# Patient Record
Sex: Female | Born: 1970 | Race: White | Hispanic: No | Marital: Married | Smoking: Current every day smoker
Health system: Southern US, Community
[De-identification: ages and names within clinical notes are randomized; demographics above are authoritative.]

## PROBLEM LIST (undated history)

## (undated) DIAGNOSIS — E079 Disorder of thyroid, unspecified: Secondary | ICD-10-CM

## (undated) DIAGNOSIS — M549 Dorsalgia, unspecified: Secondary | ICD-10-CM

## (undated) DIAGNOSIS — I10 Essential (primary) hypertension: Secondary | ICD-10-CM

## (undated) DIAGNOSIS — D649 Anemia, unspecified: Secondary | ICD-10-CM

## (undated) DIAGNOSIS — F329 Major depressive disorder, single episode, unspecified: Secondary | ICD-10-CM

## (undated) DIAGNOSIS — G8929 Other chronic pain: Secondary | ICD-10-CM

## (undated) DIAGNOSIS — F419 Anxiety disorder, unspecified: Secondary | ICD-10-CM

## (undated) DIAGNOSIS — K219 Gastro-esophageal reflux disease without esophagitis: Secondary | ICD-10-CM

## (undated) DIAGNOSIS — F32A Depression, unspecified: Secondary | ICD-10-CM

## (undated) HISTORY — DX: Gastro-esophageal reflux disease without esophagitis: K21.9

## (undated) HISTORY — DX: Disorder of thyroid, unspecified: E07.9

## (undated) HISTORY — PX: APPENDECTOMY: SHX54

## (undated) HISTORY — DX: Anemia, unspecified: D64.9

## (undated) HISTORY — PX: TUBAL LIGATION: SHX77

---

## 2001-03-20 ENCOUNTER — Ambulatory Visit (HOSPITAL_COMMUNITY): Admission: RE | Admit: 2001-03-20 | Discharge: 2001-03-20 | Payer: Self-pay | Admitting: Family Medicine

## 2001-03-20 ENCOUNTER — Encounter: Payer: Self-pay | Admitting: Family Medicine

## 2001-10-25 ENCOUNTER — Other Ambulatory Visit: Admission: RE | Admit: 2001-10-25 | Discharge: 2001-10-25 | Payer: Self-pay | Admitting: Unknown Physician Specialty

## 2001-11-01 ENCOUNTER — Encounter: Payer: Self-pay | Admitting: Unknown Physician Specialty

## 2001-11-01 ENCOUNTER — Ambulatory Visit (HOSPITAL_COMMUNITY): Admission: RE | Admit: 2001-11-01 | Discharge: 2001-11-01 | Payer: Self-pay | Admitting: Unknown Physician Specialty

## 2003-06-20 ENCOUNTER — Inpatient Hospital Stay (HOSPITAL_COMMUNITY): Admission: EM | Admit: 2003-06-20 | Discharge: 2003-06-21 | Payer: Self-pay | Admitting: Surgery

## 2003-06-20 ENCOUNTER — Encounter (INDEPENDENT_AMBULATORY_CARE_PROVIDER_SITE_OTHER): Payer: Self-pay | Admitting: Specialist

## 2004-03-10 ENCOUNTER — Observation Stay (HOSPITAL_COMMUNITY): Admission: AD | Admit: 2004-03-10 | Discharge: 2004-03-11 | Payer: Self-pay | Admitting: Internal Medicine

## 2004-04-13 ENCOUNTER — Ambulatory Visit: Payer: Self-pay | Admitting: Internal Medicine

## 2007-01-18 ENCOUNTER — Emergency Department (HOSPITAL_COMMUNITY): Admission: EM | Admit: 2007-01-18 | Discharge: 2007-01-18 | Payer: Self-pay | Admitting: Emergency Medicine

## 2007-02-10 ENCOUNTER — Ambulatory Visit (HOSPITAL_COMMUNITY): Admission: RE | Admit: 2007-02-10 | Discharge: 2007-02-10 | Payer: Self-pay | Admitting: Internal Medicine

## 2007-03-24 ENCOUNTER — Ambulatory Visit (HOSPITAL_COMMUNITY): Admission: RE | Admit: 2007-03-24 | Discharge: 2007-03-24 | Payer: Self-pay | Admitting: Family Medicine

## 2007-06-22 ENCOUNTER — Ambulatory Visit (HOSPITAL_COMMUNITY): Admission: RE | Admit: 2007-06-22 | Discharge: 2007-06-22 | Payer: Self-pay | Admitting: Internal Medicine

## 2007-08-22 ENCOUNTER — Encounter
Admission: RE | Admit: 2007-08-22 | Discharge: 2007-11-20 | Payer: Self-pay | Admitting: Physical Medicine & Rehabilitation

## 2007-08-22 ENCOUNTER — Ambulatory Visit: Payer: Self-pay | Admitting: Physical Medicine & Rehabilitation

## 2007-10-04 ENCOUNTER — Ambulatory Visit: Payer: Self-pay | Admitting: Physical Medicine & Rehabilitation

## 2008-01-03 ENCOUNTER — Encounter
Admission: RE | Admit: 2008-01-03 | Discharge: 2008-01-17 | Payer: Self-pay | Admitting: Physical Medicine & Rehabilitation

## 2008-01-17 ENCOUNTER — Ambulatory Visit: Payer: Self-pay | Admitting: Physical Medicine & Rehabilitation

## 2008-04-16 ENCOUNTER — Encounter
Admission: RE | Admit: 2008-04-16 | Discharge: 2008-04-17 | Payer: Self-pay | Admitting: Physical Medicine & Rehabilitation

## 2008-04-17 ENCOUNTER — Ambulatory Visit: Payer: Self-pay | Admitting: Physical Medicine & Rehabilitation

## 2008-09-03 ENCOUNTER — Other Ambulatory Visit: Admission: RE | Admit: 2008-09-03 | Discharge: 2008-09-03 | Payer: Self-pay | Admitting: Obstetrics and Gynecology

## 2008-09-05 ENCOUNTER — Encounter
Admission: RE | Admit: 2008-09-05 | Discharge: 2008-09-09 | Payer: Self-pay | Admitting: Physical Medicine & Rehabilitation

## 2008-09-05 ENCOUNTER — Ambulatory Visit (HOSPITAL_COMMUNITY): Admission: RE | Admit: 2008-09-05 | Discharge: 2008-09-05 | Payer: Self-pay | Admitting: Obstetrics & Gynecology

## 2008-09-09 ENCOUNTER — Ambulatory Visit: Payer: Self-pay | Admitting: Physical Medicine & Rehabilitation

## 2009-11-06 ENCOUNTER — Ambulatory Visit (HOSPITAL_COMMUNITY): Admission: RE | Admit: 2009-11-06 | Discharge: 2009-11-06 | Payer: Self-pay | Admitting: Internal Medicine

## 2010-08-02 ENCOUNTER — Encounter: Payer: Self-pay | Admitting: Otolaryngology

## 2010-11-24 NOTE — Assessment & Plan Note (Signed)
A 40 year old female who has been followed in this clinic for  approximately 1 years' time.  She has a history of low back as well as  mid back pain.  She states that she has had pain for about 5 years.  She  has had a MRI in January 18, 2007 that demonstrated L4-5, L5-S1 mild facet  joint degenerative changes and a mild bulge at T11-12.  She has had no  traumatic event.  She remembers an incident where she had pain following  carrying around a 5 gallon bucket around the leg.  She has been treated  by primary care using Flexeril and Percocet and referred to this clinic.  She states she has had some physical therapy in the past.  She continues  to what sounds like flexion exercises.  She has had a thoracic spine MRI  demonstrated an T11-12 disk right paracentral nerve root encroachment.  She has had no spine interventional procedures.  She has used a TENS  unit at night.  She uses Soma in addition to her Percocet and has used  Skelaxin before, but this was too expensive with co-pays.  Other  medications include Celexa and Xanax prescribed by primary care.  She  also later has a history of being on Tussionex before urine drug screen  was performed.   PHYSICAL EXAMINATION:  GENERAL:  No acute distress.  She states she ran  out of her medicine on Sunday morning about 2:00 a.m. was the last dose.  The pain does improve with medication TENS, increases with walking,  bending, sitting, and standing.  She drives.  She climbs steps.  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  She has review  of systems positive for depression, anxiety, which improves with  medications as well as spasms in her back.  She states that she is being  evaluated for possible hysterectomy.   Her blood pressure is 146/78, pulse 91, respirations 18, and O2 sat 99%  on room air.   Overweight female in no acute stress.  Orientation x3.  Affect is alert.  Gait is normal.  She has pain more with toe walk and  heel walk, but has  no weakness in her lower extremities as result of this.  She has some  tenderness to palpation around the thoracic paraspinals, T6-7 and in the  lumbar paraspinals L4-5 and S1 areas.  Upper and lower extremity  sensation is normal.  Extremity and lower extremity deep tendon reflexes  are normal.   Upper and lower extremity range of motion and joint stability is normal.  Neck, range of motion is full but has pain with extension that radiates  down towards the upper thoracic area.   In her lumbar spine, she has 50% forward flexion and extension.   Motor strength is full in upper and lower extremities.   IMPRESSION:  1. Lumbosacral spondylosis.  I believe this is likely a pain      generator, but we need to further confirm using fluoroscopically-      guided injections.  2. She states that she may be undergoing hysterectomy soon so      certainly, we would defer until after that has done.  3. Thoracic pain.  I do not think that T11-12 disk is symptomatic.      This may actually be lower cervical spondylosis causing some facet      syndrome and radiating pain into the parascapular area.  This  could      also be further evaluated with cervical medial branch blocks, but      once again we will defer until after surgery.  In terms of her      narcotic analgesic in the postoperative pain management, we will      let OB/GYN manages for the first month postop and then we will      resume.  In preparation for improved postsurgical pain management,      we will drop her dose to 5 times per day and a Percocet 4 times a      day for a week and thereafter in the preoperative time.  4. We will switch her from Soma to Robaxin, given the metabolite of      meprobamate which may further interact given that she is already on      benzodiazepines.   I discussed with the patient.  She is in agreement with the above plan.  We will check the urine drug screen today.  We will need to  do [facility] check to see who is prescribing Tussionex and whether  any other prescribers are evident.      Erick Colace, M.D.  Electronically Signed     AEK/MedQ  D:  09/09/2008 13:59:21  T:  09/10/2008 03:49:44  Job #:  [id number]   cc:   Catalina Pizza, M.D.  Fax: [phone]   Cyril Mourning, OB/GYN, [place]

## 2010-11-24 NOTE — Assessment & Plan Note (Signed)
[name] returns to the clinic today for followup evaluation.  She  reports that she is getting a good bit of relief from the Percocet and  Soma medication.  She generally takes approximately 6 Percocet per day  and generally 2-3 Soma per day.  She plans to start a diet, but still is  smoking 1 pack of cigarettes per day.  She reports that she would like  to lose weight and come off tobacco if at all possible.  Her medications  otherwise have been unchanged.   MEDICATIONS:  1. Celexa 20 mg daily.  2. Xanax 0.5 mg q.i.d. p.r.n.  3. Pro-Air p.r.n.  4. Percocet 10/325 one tablet 6 times per day p.r.n.  5. Soma 350 mg t.i.d. p.r.n.   REVIEW OF SYSTEMS:  Noncontributory.   PHYSICAL EXAMINATION:  GENERAL:  Well-appearing, healthy adult female in  mild acute discomfort.  VITAL SIGNS:  Blood pressure 136/88 with a pulse of 94, respiratory rate  18, and O2 saturation 98% on room air.  EXTREMITIES:  She has 5-/5 strength throughout the bilateral upper and  lower extremities.  She ambulates without any assistive device.   IMPRESSION:  Chronic mid back pain/low back pain with evidence of minor  disk bulge at T11-T12.   In the office today, no refill on medication is necessary as those were  recently refilled a few days ago.  We will plan on seeing the patient in  followup in approximately 3 months' time with refills prior to that  appointment if necessary.           ______________________________  Ellwood Dense, M.D.     DC/MedQ  D:  04/17/2008 09:46:53  T:  04/18/2008 00:05:37  Job #:  [id number]

## 2010-11-24 NOTE — Assessment & Plan Note (Signed)
HISTORY:  [name] returns to clinic today for followup evaluation.  She reports that overall she is doing well.  She does take a Percocet,  generally 5 tablets per day, but does use an occasional extra tablet  through the night.  She wonders about possibly having an adjustment in  the medication.  She also takes her Soma, generally 2 to 3 times per  day, but does not need a refill on the Wall.  She does need a refill on  the Percocet.  Her other medicines have stayed unchanged including the  Celexa and Xanax.   MEDICATIONS:  1. Celexa 20 mg daily.  2. Percocet 10/325 one tablet 5-6 times per day p.r.n.  3. Xanax 0.5 mg q.i.d. p.r.n.  4. ProAir p.r.n.   REVIEW OF SYSTEMS:  Noncontributory.   PHYSICAL EXAMINATION:  GENERAL:  Well-appearing, healthy adult female in  mild-to-no acute discomfort.  VITAL SIGNS:  Blood pressure is 139/85 with a pulse of 86, respiratory  rate 18, and O2 saturation 98% on room air.  She has 5-/5 strength  throughout the bilateral upper and lower extremities.  She ambulates  without any assisted device.   IMPRESSION:  Chronic mid back pain/low back pain with evidence of minor  disk bulge at T11-T12.   In the office today, we did adjust the patient's Percocet up to a  maximum of 6 tablets per day, a total of 180 were prescribed.  Prescription was written today, January 17, 2008.  No refill on the Tresa Garter is  necessary at this time.  We plan on seeing her in followup in  approximately 3 months' time.           ______________________________  Ellwood Dense, M.D.     DC/MedQ  D:  01/17/2008 09:51:59  T:  01/18/2008 02:59:40  Job #:  [id number]

## 2010-11-24 NOTE — Assessment & Plan Note (Signed)
[name] returns to the clinic today for follow-up evaluation.  I  first and last saw her in this office on August 23, 2007 for  evaluation and treatment of chronic low back pain.  At that time, we had  decided to continue her on the Percocet 10/325 1 tablet q.i.d. p.r.n.  along with the Skelaxin 800 mg t.i.d. and the Ultram 50 mg q.i.d. p.r.n.  She reports that she has really noticed no improvement with the Ultram  and would like to stop that medication.  The Skelaxin is not available  in a generic form, and it is too expensive for her with her insurance.  She would like to try a different muscle relaxer.  I have discussed with  her Robaxin and Soma as alternatives, and she will check with her  pharmacist to see how expensive those might be for her.   In terms of the Percocet, she has been using 4-5 tablets per day.  She  would like to have an adjustment up to 5 tablets per day, as that does  give her good benefit overall.  She continues to get Xanax and Celexa  through her primary care physician.   MEDICATIONS:  1. Celexa 20 mg daily.  2. Percocet 10/325 1 tablet 4-5 times per day p.r.n.  3. Xanax 0.5 mg q.i.d. p.r.n.  4. Skelaxin 800 mg t.i.d. p.r.n. (too expensive).  5. Ultram 50 mg q.i.d. p.r.n. (not helpful).  6. ProAire p.r.n.   REVIEW OF SYSTEMS:  Noncontributory.   PHYSICAL EXAMINATION:  A well-appearing, healthy adult female in mild  acute discomfort.  Blood pressure 147/88 with a pulse of 91, respiratory rate 18, O2  saturation 99% on room air.  Patient ambulates without any assistive device.  She is -5/5 strength  throughout the bilateral upper and lower extremities.   IMPRESSION:  Chronic mid back pain/low back pain with evidence of minor  disk bulge at T11-12.   In the office today, we did have the patient stop the Ultram completely.  We gave her samples of Celexa and asked her to ask her pharmacist about  the cost of Soma versus Robaxin in place of the  Skelaxin.  We allowed  her to go up to 5 tablets per day on her present prescription with the  Percocet and then refilled that at five times per day as of October 25, 2007.  Will plan on seeing the patient in followup in approximately  three months time with refills prior to that appointment as necessary.  She will be calling back to let us know which one of the muscle  relaxers, either the Robaxin or Soma, she would like to use in place of  the Skelaxin.           ______________________________  Ellwood Dense, M.D.     DC/MedQ  D:  10/06/2007 15:49:08  T:  10/06/2007 18:25:04  Job #:  [id number]

## 2010-11-24 NOTE — Group Therapy Note (Signed)
PURPOSE OF EVALUATION:  Evaluate and treat chronic back pain.   HISTORY OF PRESENT ILLNESS:  [name] is a 40 year old adult female  referred to this office by Dr. Catalina Pizza, her present primary care  physician, and Dr. Sherwood Gambler, her prior primary care physician.   The patient has medical records that were sent prior to the office visit  today, and then those were reviewed with her in the office today.   The patient reports off and on back pain for the past 12 years since her  last children were born, including twins.  She reports that the pain had  been off and on, but then started becoming more consistent and flaring  mid summer 2008.  She reports that she specifically remembers a time  when she was carrying a 5-pound bucket around [place], [place] with her  children, when she had the acute pain.   The patient reports that Dr. Sherwood Gambler, her primary care physician,  initially treated her with anti-inflammatory medications without  substantial improvement.   January 18, 2007 the patient underwent an MRI scan of her lumbar spine which  showed T11-T12 mild bulge, along with mild facet joint degenerative  changes at L4-L5 and L5-S1.   January 20, 2007 the patient saw Dr. Sherwood Gambler in followup.  At that time she  was prescribed Flexeril along with Percocet and voltaren for low back  pain with radiculopathy.   February 10, 2007 the patient had a refill on her Percocet 10/325 one table  q.6 h., and was referred for pain management.   February 10, 2007 the patient underwent an MRI scan of her thoracic spine  which showed shallow, right paracentral protrusion at T11-T12 without  apparent neural encroachment.  Otherwise, MRI scan of her thoracic spine  was negative.   March 07, 2007 the patient underwent a Depo-Medrol injection of 80 mg  into her left hip.   The patient had her Percocet refilled as of August 2008.  She had a fall  subsequent to that, and x-rays of her coccyx and sacrum were negative  for a fracture.  She started using a TENS unit approximately September  2008, and continues to use that on a daily basis.  She had her Percocet  refilled as of October and November 2008 though Dr. Sherwood Gambler.   June 21, 2007 the patient underwent blood testing, including  rheumatoid arthritis, uric acid, ANA titer, lime disease titer.  All of  these were within normal limits.   December 2008, the patient underwent an MRI scan of her left shoulder  after she had problems elevating her left shoulder.  That was negative  for occult osseous injury with no evidence of rotator cuff injury and no  labral tear.  There was no osseous stress changes to suggest  impingement.   Presently, the patient reports that she has pain located in her area  between her shoulder blades posteriorly.  She complains of some lower  back pain, but most of the pain is located between the shoulder blades  just at the bra line.  She reports that she gets a fair amount of relief  from the Percocet, Ultram, and Skelaxin as noted below.  She uses an  average of approximately 4 per day of the Percocet and that seems to  give her a good bit of relief.  She denies any constipation or  grogginess related to the medicines, although she does report she gets  only slightly sleepy with the Skelaxin.  She does use her TENS unit on a  daily basis along with her home exercise program.  She denies any bowel  or bladder incontinence.   PAST MEDICAL HISTORY:  1. History of sinusitis/pharyngitis/bronchitis.  2. Depression.  3. Prior appendectomy.  4. Prior tubal ligation.   ALLERGIES:  No known drug allergies.   FAMILY HISTORY:  Her mother is deceased from a stroke and her father is  alive and well.   SOCIAL HISTORY:  The patient is single and has 4 children; 2 age 81, 1  age 59, and 1 age 2.  She reports rare alcohol usage and smokes  approximately 1 pack per day of cigarettes.  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   MEDICATIONS:  1. Celexa 20 mg daily.  2. Percocet 10/325 one table q.i.d. p.r.n.  3. Xanax 0.5 mg q.i.d. p.r.n.  4. Skelaxin 800 mg t.i.d. p.r.n.  5. Ultram 50 mg q.i.d. p.r.n.  6. ProAir p.r.n.   REVIEW OF SYSTEMS:  Positive for weight gain along with respiratory  infections and coughing.   PHYSICAL EXAMINATION:  A well-appearing, healthy, adult female.  Height 5 feet 1 inch, weight 200 pounds, blood pressure 147/83 with a  pulse of 80 and O2 saturation 99% on room air.  The patient ambulates without any assistive device.  She is casually  dressed in her uniform from the dermatology office.  Upper extremity range of motion was full and pain free, but the patient  does report having taking Percocet prior to the office visit today.  She  reports that most of the upper extremity range of motion exercises would  have been painful for her if she had not taken the Percocet.  Lumbar  range of motion showed only a slight decrease in extension and lateral  bending with good flexion, although she, again, reported low back pain  with forward flexion.  Upper extremity exam showed normal bulk and tone  throughout.  Reflexes were 2+ and symmetrical and sensation was intact  to light touch throughout the bilateral upper extremities.  Strength was  5-/5 throughout the bilateral upper extremities.  Lower extremity exam showed normal bulk and tone throughout.  Strength  was 5-/5 in hip flexion, knee extension, and ankle dorsiflexion with  normal reflexes and normal sensation.  In the supine position, straight  leg raise was negative to 30 degrees, and range of motion was within  functional limits.   IMPRESSION:  Chronic mid back pain/low back pain with evidence of minor  disk bulge at T11-T12 on MRI scan.   Presently the patient's pain is mostly located in the thoracic region  between the shoulder blades.  Her only  abnormality on MRI scan was a  slight bulge at T11-T12.  It may be that most of her pain in the mid  thoracic region is secondary to muscular tightness.  I have asked her to  continue her TENS unit along with her home exercise program.  We will  take over prescribing her Skelaxin, Ultram and Percocet for her.  She  will continue to get her Celexa and Xanax from her primary care  physician at this point.   No refill on her Percocet, Skelaxin, or Ultram are necessary at this  point.  When she does need a refill, I have asked her to call in  approximately 5 days ahead of time so that we can get  that for her, and  she can have someone pick it up for her if she is not available.  Otherwise, we will plan on seeing the patient in followup in  approximately 2 months' time.  The patient does report relief that she  does not have to have injections.  She was worried about injections, and  apparently does not wish to have any, at least at the present time.   We will plan on seeing the patient in followup as noted above.           ______________________________  Ellwood Dense, M.D.     DC/MedQ  D:  08/23/2007 11:33:24  T:  08/24/2007 13:21:34  Job #:  [id number]   cc:   Catalina Pizza, M.D.  Fax: [phone]

## 2010-11-27 NOTE — Op Note (Signed)
NAME:  [name], [name] NO.:  [id number]   MEDICAL RECORD NO.:  [id number]                   PATIENT TYPE:  INP   LOCATION:  0001                                 FACILITY:  [facility]   PHYSICIAN:  Abigail Miyamoto, M.D.              DATE OF BIRTH:  [birth year]   DATE OF PROCEDURE:  06/20/2003  DATE OF DISCHARGE:                                 OPERATIVE REPORT   PREOPERATIVE DIAGNOSIS:  Acute appendicitis.   POSTOPERATIVE DIAGNOSIS:  Acute appendicitis.   PROCEDURE:  Laparoscopic appendectomy.   SURGEON:  Abigail Miyamoto, M.D.   ANESTHESIA:  General endotracheal anesthesia and 0.25% Marcaine.   ESTIMATED BLOOD LOSS:  Minimal.   INDICATIONS FOR PROCEDURE:  [name] is a 40 year old female who  presents to [facility]'s office after a CAT scan showed  possible retrocecal appendicitis.  The patient did have mild abdominal  tenderness but had a normal white blood count and she was sent to the [facility] by Dr. Darnell Level and underwent a second CAT scan but this  time with oral contrast which confirmed acute appendicitis.   FINDINGS:  The patient was indeed found to have acute appendicitis without  evidence of perforation or rupture.   DESCRIPTION OF PROCEDURE:  The patient was brought to the operating room,  identified as [name].  She was placed supine on the operating table  and general anesthesia was induced.  Her abdomen was then prepped and draped  in the usual sterile fashion.  Using a #15 blade, a small vertical incision  was made below the umbilicus. The incision was carried down through the  fascia to the _________with the scalpel.  A hemostat was used to pass  through the peritoneal cavity.  Next a #0 Vicryl pursestring suture was  placed around the fascial opening.  The Hasson port was placed through the  opening and insufflation of the abdomen was begun. A 5 mm port was then  placed in the patient's  epigastrium under direct vision.  The cecum was  identified and rotated medially. The appendix was found to be underneath the  cecum and it lifted up easily and was found to have acute appendicitis.  Next a 12 mm port was placed in the patient's midline at the pubis.  The  appendix was then elevated. The mesoappendix was dissected out and was taken  down with the harmonic scalpel.  This allowed visualization of the base of  the appendix at the cecum.  The appendix was then transected at the base  with an endoscopic GIA stapler.  Once the appendix was transected, the  appendiceal stump was examined and hemostasis felt to be achieved.  The  appendix was then placed in a sac and pulled out through the incision at the  umbilicus.  The #0 Vicryl at the umbilicus was tied in place closing the  fascial defect.  The  cecum and appendiceal stump were again examined and  hemostasis was felt to be achieved. All ports were then removed under direct  vision and the abdomen was deflated.  All incisions were then anesthetized with 0.25% Marcaine and closed with 4-0  Monocryl subcuticular sutures. Steri-Strips, gauze and tape were then  applied. All sponge, needle and instrument counts were correct at the end of  the procedure.  The patient was then extubated in the operating room and  taken in stable condition to the recovery room.                                               Abigail Miyamoto, M.D.    DB/MEDQ  D:  06/20/2003  T:  06/21/2003  Job:  [id number]

## 2010-11-27 NOTE — H&P (Signed)
[name], [name] NO.:  [id number]   MEDICAL RECORD NO.:  [id number]                   PATIENT TYPE:   LOCATION:                                       FACILITY:   PHYSICIAN:  Madelin Rear. Sherwood Gambler, M.D.             DATE OF BIRTH:  1970-10-14   DATE OF ADMISSION:  DATE OF DISCHARGE:                                HISTORY & PHYSICAL   CHIEF COMPLAINT:  Passing out.   HISTORY OF PRESENT ILLNESS:  The patient has noted progressively increasing  orthostatic dizziness and near syncope x2 days.  She was seen on March 06, 2004 with asthmatic bronchitis.  Nebulizer treatment and Levapak were  prescribed, taken and she had some improvement in her sinus drainage and  cough.  The cough is now nonproductive although still present.  She denies  any vertigo.   PAST MEDICAL HISTORY:  1.  Bronchitis.  2.  Bronchospasms.   SOCIAL HISTORY:  Positive smoker, negative alcohol or other drug use.   FAMILY HISTORY:  Noncontributory for this illness.   REVIEW OF SYSTEMS:  As under HPI, denies specifically any palpitations,  headache, stiff neck, photophobia, chest pain, hematemesis, hematochezia,  melena or heavy menstrual flow.  Review of systems otherwise negative.   PHYSICAL EXAMINATION:  GENERAL:  She is awake, alert and cooperative.  HEENT:  No JVD or adenopathy.  Neck was supple.  CHEST:  Scattered rhonchi.  CARDIAC:  Regular rate and rhythm without murmurs, rubs or gallops.  ABDOMEN:  Soft, nontender, no organomegaly or masses.  EXTREMITIES:  Without clubbing, cyanosis or edema.  NEUROLOGIC:  Normal.   ASSESSMENT IN THE OFFICE:  Orthostatic vital signs revealed 80-90 lying with  a rapid increase in her pulse to greater than 110 on standing.  She had no  symptoms of diaphoresis in the office.   IMPRESSION:  Orthostatic near syncope with hypotension and tachycardia.  The  patient appears to be volume depleted and not septic at present.  We will  admit to  the hospital for IV hydration, rule out hypovolemia, rule out  sepsis, rule out pulmonary embolus.  Endocrinopathies will also be sought  with rule out addisonian crisis, although doubtful based on history and  exam.    ___________________________________________                                         Madelin Rear. Sherwood Gambler, M.D.   LJF/MEDQ  D:  03/10/2004  T:  03/10/2004  Job:  [id number]

## 2010-11-27 NOTE — H&P (Signed)
NAME:  [name] NO.:  [id number]   MEDICAL RECORD NO.:  [id number]                   PATIENT TYPE:   LOCATION:                                       FACILITY:   PHYSICIAN:  Velora Heckler, M.D.                DATE OF BIRTH:  [birth year]   DATE OF ADMISSION:  06/20/2003  DATE OF DISCHARGE:                                HISTORY & PHYSICAL   REFERRING PHYSICIAN:  Sharlot Gowda, M.D.   PRIMARY CARE PHYSICIAN:  Patrica Duel, M.D.   CHIEF COMPLAINT:  Abdominal pain, rule out appendicitis.   HISTORY OF PRESENT ILLNESS:  The patient is a 40 year old white female who  presents with five-day history of abdominal discomfort, nausea, and low  grade fever.  The patient was seen and evaluated yesterday by Dr. Sharlot Gowda.  She was found to have a normal white count.  She underwent CT scan  of abdomen and pelvis at [facility] which showed findings  suspicious for retrocecal appendix with possible early appendicitis.  The  patient's abdominal discomfort is localized to the right lower quadrant.  She is anorectic.  She presents today at our office for assessment.   PAST MEDICAL HISTORY:  1. Status post D&C, 1993.  2. Status post tubal ligation, 1996.  3. Status post oral surgery, 1998.   MEDICATIONS:  1. Allegra D p.r.n.  2. Motrin p.r.n.   ALLERGIES:  None known.   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  She smokes a pack of cigarettes a day.  She does not drink  alcohol.  She is accompanied by her husband.  She has four children.   FAMILY HISTORY:  Notable for stroke, hypertension, COPD in the patient's  mother.   REVIEW OF SYSTEMS:  Fifteen system review documented in our medical record  notable for sinus problems, nausea, vomiting.  Pap smear up to date.   PHYSICAL EXAMINATION:  GENERAL:  A 40 year old ill-appearing white female in  mild to moderate discomfort.  VITAL SIGNS:   Temperature 99.4, pulse 82, blood pressure 123/83, weight 160  pounds.  HEENT:  Shows her to be normocephalic.  Sclerae are clear.  Conjunctivae are  clear.  Pupils are equal and reactive.  Mucous membranes are dry.  Dentition  is good.  Voice is normal.  NECK:  Supple without mass.  Thyroid is normal without nodularity.  There is  no lymphadenopathy.  LUNGS:  Clear to auscultation with few scattered rhonchi and expiratory  wheezes.  CARDIAC:  Shows regular rate and rhythm without murmur.  ABDOMEN:  Soft.  Bowel sounds are present.  There are striae on the  abdominal wall.  There are well-healed surgical wounds.  Palpation reveals  mild to moderate tenderness in the right lower quadrant.  There is mild  guarding.  There is no rebound.  There is no palpable mass.  EXTREMITIES:  Nontender without edema.  NEUROLOGIC:  The patient is alert and oriented without focal deficit.   LABORATORY DATA:  CT scan reviewed with Dr. Delphia Grates at [facility].   Laboratory studies from December 8 at [facility]  show a white count of 5.1, hemoglobin 14.1, hematocrit 40.8%, platelet count  253,000.  Chemistry profile is normal.  Liver function tests are normal.   IMPRESSION:  Abdominal pain, rule out retrocecal appendix with acute  appendicitis.   PLAN:  1. Admit patient to [facility].  2. CT scan of abdomen and pelvis with oral contrast.  3. Repeat laboratory work.  4. Repeat physical exam.  5. Possible operative intervention.   The patient was discussed with Dr. Ardelle Lesches from my practice who was on  call at [facility].  He will also see the patient this  evening following her CT scan.                                               Velora Heckler, M.D.    TMG/MEDQ  D:  06/20/2003  T:  06/20/2003  Job:  [id number]   cc:   Sharlot Gowda, M.D.  [address]  [place], [place] [address]  Fax: [phone]   Patrica Duel, M.D.  [address], [address]  [place]  [place] [address]  Fax: [phone]

## 2012-04-03 ENCOUNTER — Emergency Department (HOSPITAL_COMMUNITY): Payer: Medicaid Other

## 2012-04-03 ENCOUNTER — Emergency Department (HOSPITAL_COMMUNITY)
Admission: EM | Admit: 2012-04-03 | Discharge: 2012-04-03 | Disposition: A | Discharge status: Home / Self Care | Payer: Medicaid Other | Attending: Emergency Medicine | Admitting: Emergency Medicine

## 2012-04-03 ENCOUNTER — Encounter (HOSPITAL_COMMUNITY): Payer: Self-pay

## 2012-04-03 DIAGNOSIS — S92909A Unspecified fracture of unspecified foot, initial encounter for closed fracture: Secondary | ICD-10-CM

## 2012-04-03 DIAGNOSIS — F17200 Nicotine dependence, unspecified, uncomplicated: Secondary | ICD-10-CM | POA: Insufficient documentation

## 2012-04-03 DIAGNOSIS — X500XXA Overexertion from strenuous movement or load, initial encounter: Secondary | ICD-10-CM | POA: Insufficient documentation

## 2012-04-03 DIAGNOSIS — F411 Generalized anxiety disorder: Secondary | ICD-10-CM | POA: Insufficient documentation

## 2012-04-03 DIAGNOSIS — F3289 Other specified depressive episodes: Secondary | ICD-10-CM | POA: Insufficient documentation

## 2012-04-03 DIAGNOSIS — I10 Essential (primary) hypertension: Secondary | ICD-10-CM | POA: Insufficient documentation

## 2012-04-03 DIAGNOSIS — S92309A Fracture of unspecified metatarsal bone(s), unspecified foot, initial encounter for closed fracture: Secondary | ICD-10-CM | POA: Insufficient documentation

## 2012-04-03 DIAGNOSIS — S93409A Sprain of unspecified ligament of unspecified ankle, initial encounter: Secondary | ICD-10-CM | POA: Insufficient documentation

## 2012-04-03 DIAGNOSIS — M549 Dorsalgia, unspecified: Secondary | ICD-10-CM | POA: Insufficient documentation

## 2012-04-03 DIAGNOSIS — F329 Major depressive disorder, single episode, unspecified: Secondary | ICD-10-CM | POA: Insufficient documentation

## 2012-04-03 DIAGNOSIS — G8929 Other chronic pain: Secondary | ICD-10-CM | POA: Insufficient documentation

## 2012-04-03 HISTORY — DX: Major depressive disorder, single episode, unspecified: F32.9

## 2012-04-03 HISTORY — DX: Dorsalgia, unspecified: M54.9

## 2012-04-03 HISTORY — DX: Anxiety disorder, unspecified: F41.9

## 2012-04-03 HISTORY — DX: Essential (primary) hypertension: I10

## 2012-04-03 HISTORY — DX: Depression, unspecified: F32.A

## 2012-04-03 HISTORY — DX: Other chronic pain: G89.29

## 2012-04-03 MED ORDER — IBUPROFEN 800 MG PO TABS: 800.0000 mg | ORAL_TABLET | Freq: Three times a day (TID) | ORAL | Status: DC

## 2012-04-03 MED ORDER — HYDROCODONE-ACETAMINOPHEN 5-325 MG PO TABS
1.0000 | ORAL_TABLET | Freq: Once | ORAL | Status: AC
  Administered 2012-04-03: 1 via ORAL
  Filled 2012-04-03: qty 1

## 2012-04-03 MED ORDER — HYDROCODONE-ACETAMINOPHEN 5-325 MG PO TABS: 1.0000 | ORAL_TABLET | ORAL | Status: AC | PRN

## 2012-04-03 MED ORDER — IBUPROFEN 800 MG PO TABS
800.0000 mg | ORAL_TABLET | Freq: Once | ORAL | Status: AC
  Administered 2012-04-03: 800 mg via ORAL
  Filled 2012-04-03: qty 1

## 2012-04-03 NOTE — ED Provider Notes (Signed)
History     CSN: [id number]  Arrival date & time 04/03/12  5284   First MD Initiated Contact with Patient 04/03/12 [phone]      Chief Complaint  Patient presents with  . Ankle Injury    (Consider location/radiation/quality/duration/timing/severity/associated sxs/prior treatment) HPI   [name] is a 41 y.o. female who presents to the Emergency Department complaining of pain to right foot and ankle that occurred at 1200 AM when she took a mistep on the stairs inverted her ankle. Instant swelling and bruising to the lateral right foot. She has taken no medicine. Pain is worse when trying to ambulate. Past Medical History  Diagnosis Date  . Anxiety   . Depression   . Hypertension   . Back pain, chronic     Past Surgical History  Procedure Date  . Appendectomy   . Tubal ligation     No family history on file.  History  Substance Use Topics  . Smoking status: Current Every Day Smoker  . Smokeless tobacco: Not on file  . Alcohol Use: No    OB History    Grav Para Term Preterm Abortions TAB SAB Ect Mult Living                  Review of Systems  Constitutional: Negative for fever.       10 Systems reviewed and are negative for acute change except as noted in the HPI.  HENT: Negative for congestion.   Eyes: Negative for discharge and redness.  Respiratory: Negative for cough and shortness of breath.   Cardiovascular: Negative for chest pain.  Gastrointestinal: Negative for vomiting and abdominal pain.  Musculoskeletal: Negative for back pain.       Right ankle and foot pain  Skin: Negative for rash.  Neurological: Negative for syncope, numbness and headaches.  Psychiatric/Behavioral:       No behavior change.    Allergies  Review of patient's allergies indicates no known allergies.  Home Medications   Current Outpatient Rx  Name Route Sig Dispense Refill  . ALPRAZOLAM 1 MG PO TABS Oral Take 1 mg by mouth 4 (four) times daily.    . ARIPIPRAZOLE 5 MG  PO TABS Oral Take 5 mg by mouth daily.    Marland Kitchen HYDROMORPHONE HCL 4 MG PO TABS Oral Take 4 mg by mouth 3 (three) times daily.    Marland Kitchen HYDROXYZINE HCL 10 MG PO TABS Oral Take 10 mg by mouth 2 (two) times daily.    Marland Kitchen LISINOPRIL-HYDROCHLOROTHIAZIDE 10-12.5 MG PO TABS Oral Take 1 tablet by mouth daily.    . SERTRALINE HCL 100 MG PO TABS Oral Take 100 mg by mouth daily.      LMP 03/27/2012  Physical Exam  Nursing note and vitals reviewed. Constitutional: She appears well-developed and well-nourished.       Awake, alert, nontoxic appearance.  HENT:  Head: Atraumatic.  Eyes: Right eye exhibits no discharge. Left eye exhibits no discharge.  Neck: Neck supple.  Cardiovascular: Normal heart sounds.   Pulmonary/Chest: Effort normal and breath sounds normal. She exhibits no tenderness.  Abdominal: Soft. There is no tenderness. There is no rebound.  Musculoskeletal: She exhibits no tenderness.       Baseline ROM, no obvious new focal weakness.Right ankle with mild swelling, ROM intact with mild pain. Lateral right foot over the 5th metatarsal with swelling and bruising. Tenderness with palpation.  Neurological:       Mental status and motor strength appears baseline  for patient and situation.  Skin: No rash noted.  Psychiatric: She has a normal mood and affect.    ED Course  Procedures (including critical care time)  Labs Reviewed - No data to display Dg Ankle Complete Right  04/03/2012  *RADIOLOGY REPORT*  Clinical Data: Ankle injury.  Twisting injury.  RIGHT ANKLE - COMPLETE 3+ VIEW  Comparison: None.  Findings: Ankle mortise is congruent.  The talar dome is intact. Lateral malleolar soft tissue swelling is present.  The alignment of the ankle is anatomic.  There may be a small effusion.  On the lateral and oblique view, there is a transverse fracture through the fifth metatarsal base, most compatible with a Jones fracture.  This fracture is nondisplaced.  IMPRESSION:  1.  Lateral malleolar soft  tissue swelling without ankle fracture. 2.  Transverse fifth metatarsal base fracture most compatible with Jones fracture.   Original Report Authenticated By: Andreas Newport, M.D.      No diagnosis found.    MDM  Patient with right foot and ankle pain after inversion injury. Xray with lateral malleolar swelling and transverse 5th metatarsal base fx. .Given antiinflammatory and analgesic. Placed in cam walker and cruthces. Dx testing d/w pt.  Questions answered.  Verb understanding, agreeable to d/c home with outpt f/u.Referral to orthopedist. Pt stable in ED with no significant deterioration in condition.The patient appears reasonably screened and/or stabilized for discharge and I doubt any other medical condition or other [facility] requiring further screening, evaluation, or treatment in the ED at this time prior to discharge.  MDM Reviewed: nursing note and vitals Interpretation: x-ray            Nicoletta Dress. Colon Branch, MD 04/03/12 4098

## 2012-04-03 NOTE — ED Notes (Signed)
Pt twisted her right ankle coming down some steps, has bruising and swelling present

## 2012-04-10 ENCOUNTER — Ambulatory Visit (INDEPENDENT_AMBULATORY_CARE_PROVIDER_SITE_OTHER): Payer: Medicaid Other | Admitting: Orthopedic Surgery

## 2012-04-10 ENCOUNTER — Encounter: Payer: Self-pay | Admitting: Orthopedic Surgery

## 2012-04-10 VITALS — BP 100/70 | Ht 62.0 in | Wt 230.0 lb

## 2012-04-10 DIAGNOSIS — S92309A Fracture of unspecified metatarsal bone(s), unspecified foot, initial encounter for closed fracture: Secondary | ICD-10-CM

## 2012-04-10 MED ORDER — HYDROMORPHONE HCL 4 MG PO TABS: 4.0000 mg | ORAL_TABLET | Freq: Three times a day (TID) | ORAL | Status: DC

## 2012-04-10 NOTE — Patient Instructions (Signed)
Walking boot   Metatarsal Fracture, Undisplaced A metatarsal fracture is a break in the bone(s) of the foot. These are the bones of the foot that connect your toes to the bones of the ankle. DIAGNOSIS   The diagnoses of these fractures are usually made with X-rays. If there are problems in the forefoot and x-rays are normal a later bone scan will usually make the diagnosis.   TREATMENT AND HOME CARE INSTRUCTIONS  Treatment may or may not include a cast or walking shoe. When casts are needed the use is usually for short periods of time so as not to slow down healing with muscle wasting (atrophy).   Activities should be stopped until further advised by your caregiver.   Wear shoes with adequate shock absorbing capabilities and stiff soles.   Alternative exercise may be undertaken while waiting for healing. These may include bicycling and swimming, or as your caregiver suggests.   It is important to keep all follow-up visits or specialty referrals. The failure to keep these appointments could result in improper bone healing and chronic pain or disability.   Warning: Do not drive a car or operate a motor vehicle until your caregiver specifically tells you it is safe to do so.  IF YOU DO NOT HAVE A CAST OR SPLINT:  You may walk on your injured foot as tolerated or advised.   Do not put any weight on your injured foot for as long as directed by your caregiver. Slowly increase the amount of time you walk on the foot as the pain allows or as advised.   Use crutches until you can bear weight without pain. A gradual increase in weight bearing may help.   Apply ice to the injury for 15 to 20 minutes each hour while awake for the first 2 days. Put the ice in a plastic bag and place a towel between the bag of ice and your skin.   Only take over-the-counter or prescription medicines for pain, discomfort, or fever as directed by your caregiver.  SEEK IMMEDIATE MEDICAL CARE IF:    Your cast gets  damaged or breaks.   You have continued severe pain or more swelling than you did before the cast was put on, or the pain is not controlled with medications.   Your skin or nails below the injury turn blue or grey, or feel cold or numb.   There is a bad smell, or new stains or pus-like (purulent) drainage coming from the cast.  MAKE SURE YOU:    Understand these instructions.   Will watch your condition.   Will get help right away if you are not doing well or get worse.  Document Released: 03/20/2002 Document Revised: 06/17/2011 Document Reviewed: 02/09/2008 [facility] Patient Information 2012 [place], [place].

## 2012-04-11 ENCOUNTER — Telehealth: Payer: Self-pay | Admitting: Orthopedic Surgery

## 2012-04-11 ENCOUNTER — Encounter: Payer: Self-pay | Admitting: Orthopedic Surgery

## 2012-04-11 NOTE — Telephone Encounter (Signed)
Annia Friendly asked if you have spoken with Dr. Gerilyn Pilgrim about the medicine discussed yesterday at her appointment. Her # [phone]

## 2012-04-11 NOTE — Progress Notes (Signed)
Patient ID: [name], female   DOB: 08-24-1970, 41 y.o.   MRN: [id number] Chief Complaint  Patient presents with  . Foot Injury    Right fifth metatarsal base fracture, and right ankle sprain, DOI 04/03/12    History. 41 year old female with history of frequent inversion injuries of the RIGHT ankle, inverted. The RIGHT ankle again fractured the base of the 5th metatarsal bone. She went to the emergency room for x-rays, diagnosed fracture there. She was placed in a Cam Walker. She complains of moderate non-radiating all aching pain over the 5th metatarsal with swelling in the foot.  Review of systems as recorded.  The patient's allergies are recorded, the medical and surgical history have been recorded, medications family history and social history have been recorded and all have been reviewed.  BP 100/70  Ht 5\' 2"  (1.575 m)  Wt 230 lb (104.327 kg)  BMI 42.07 kg/m2  LMP 03/27/2012 Vital signs are stable as recorded  General appearance is normal  The patient is alert and oriented x3  The patient's mood and affect are normal  Gait assessment: She is ambulating with a subtle limp in a walking boot The cardiovascular exam reveals normal pulses and temperature without edema swelling.  The lymphatic system is negative for palpable lymph nodes  The sensory exam is normal.  There are no pathologic reflexes.  Balance is normal.   Exam of the RIGHT foot is tender and swollen. No deformity is seen. Ankle. Range of motion is normal. Ankle joint shows laxity strength and muscle tone are normal as well. Skin is intact.  Impression 5th metatarsal fracture.  Recommend walking boot as tolerated   Addendum the patient is in chronic pain management with Dr. Gerilyn Pilgrim . She has run out of her pain medication and could not make her last appointment with him. I went to his office and discussed this with him and he approved giving her 30 dilaudid

## 2012-04-11 NOTE — Telephone Encounter (Signed)
Patient advised.

## 2012-04-11 NOTE — Telephone Encounter (Signed)
Yes and letter sent

## 2012-05-08 ENCOUNTER — Encounter: Payer: Self-pay | Admitting: Orthopedic Surgery

## 2012-05-08 ENCOUNTER — Ambulatory Visit: Payer: Medicaid Other | Admitting: Orthopedic Surgery

## 2012-09-05 ENCOUNTER — Emergency Department (HOSPITAL_COMMUNITY)
Admission: EM | Admit: 2012-09-05 | Discharge: 2012-09-05 | Disposition: A | Discharge status: Home / Self Care | Payer: Medicaid Other | Attending: Emergency Medicine | Admitting: Emergency Medicine

## 2012-09-05 ENCOUNTER — Emergency Department (HOSPITAL_COMMUNITY): Payer: Medicaid Other

## 2012-09-05 ENCOUNTER — Encounter (HOSPITAL_COMMUNITY): Payer: Self-pay

## 2012-09-05 DIAGNOSIS — Z8659 Personal history of other mental and behavioral disorders: Secondary | ICD-10-CM | POA: Insufficient documentation

## 2012-09-05 DIAGNOSIS — Z7982 Long term (current) use of aspirin: Secondary | ICD-10-CM | POA: Insufficient documentation

## 2012-09-05 DIAGNOSIS — Z3202 Encounter for pregnancy test, result negative: Secondary | ICD-10-CM | POA: Insufficient documentation

## 2012-09-05 DIAGNOSIS — I10 Essential (primary) hypertension: Secondary | ICD-10-CM | POA: Insufficient documentation

## 2012-09-05 DIAGNOSIS — G8929 Other chronic pain: Secondary | ICD-10-CM | POA: Insufficient documentation

## 2012-09-05 DIAGNOSIS — F17200 Nicotine dependence, unspecified, uncomplicated: Secondary | ICD-10-CM | POA: Insufficient documentation

## 2012-09-05 DIAGNOSIS — Z79899 Other long term (current) drug therapy: Secondary | ICD-10-CM | POA: Insufficient documentation

## 2012-09-05 DIAGNOSIS — R109 Unspecified abdominal pain: Secondary | ICD-10-CM | POA: Insufficient documentation

## 2012-09-05 LAB — URINALYSIS, ROUTINE W REFLEX MICROSCOPIC
Leukocytes, UA: NEGATIVE
Nitrite: NEGATIVE
Specific Gravity, Urine: 1.01 (ref 1.005–1.030)
Urobilinogen, UA: 0.2 mg/dL (ref 0.0–1.0)
pH: 6 (ref 5.0–8.0)

## 2012-09-05 LAB — CBC WITH DIFFERENTIAL/PLATELET
Basophils Absolute: 0 10*3/uL (ref 0.0–0.1)
Lymphocytes Relative: 20 % (ref 12–46)
Neutro Abs: 3.6 10*3/uL (ref 1.7–7.7)
Neutrophils Relative %: 67 % (ref 43–77)
Platelets: 357 10*3/uL (ref 150–400)
RDW: 14.8 % (ref 11.5–15.5)
WBC: 5.3 10*3/uL (ref 4.0–10.5)

## 2012-09-05 LAB — COMPREHENSIVE METABOLIC PANEL
Alkaline Phosphatase: 102 U/L (ref 39–117)
BUN: 13 mg/dL (ref 6–23)
GFR calc Af Amer: >90 mL/min (ref >90)
GFR calc non Af Amer: >90 mL/min (ref >90)
Glucose, Bld: 103 mg/dL — ABNORMAL HIGH (ref 70–99)
Potassium: 4.4 mEq/L (ref 3.5–5.1)
Total Bilirubin: 0.1 mg/dL — ABNORMAL LOW (ref 0.3–1.2)
Total Protein: 7.3 g/dL (ref 6.0–8.3)

## 2012-09-05 LAB — URINE MICROSCOPIC-ADD ON

## 2012-09-05 MED ORDER — HYDROMORPHONE HCL PF 1 MG/ML IJ SOLN
0.5000 mg | Freq: Once | INTRAMUSCULAR | Status: AC
  Administered 2012-09-05: 0.5 mg via INTRAVENOUS
  Filled 2012-09-05: qty 1

## 2012-09-05 MED ORDER — ONDANSETRON HCL 4 MG/2ML IJ SOLN
4.0000 mg | Freq: Once | INTRAMUSCULAR | Status: AC
  Administered 2012-09-05: 4 mg via INTRAVENOUS
  Filled 2012-09-05: qty 2

## 2012-09-05 MED ORDER — SODIUM CHLORIDE 0.9 % IV BOLUS (SEPSIS)
1000.0000 mL | Freq: Once | INTRAVENOUS | Status: AC
  Administered 2012-09-05: 1000 mL via INTRAVENOUS

## 2012-09-05 MED ORDER — OXYCODONE-ACETAMINOPHEN 5-325 MG PO TABS: 2.0000 | ORAL_TABLET | ORAL | Status: DC | PRN

## 2012-09-05 MED ORDER — HYDROMORPHONE HCL PF 1 MG/ML IJ SOLN
1.0000 mg | Freq: Once | INTRAMUSCULAR | Status: AC
  Administered 2012-09-05: 1 mg via INTRAVENOUS
  Filled 2012-09-05: qty 1

## 2012-09-05 MED ORDER — IOHEXOL 300 MG/ML  SOLN
50.0000 mL | Freq: Once | INTRAMUSCULAR | Status: AC | PRN
  Administered 2012-09-05: 50 mL via ORAL

## 2012-09-05 MED ORDER — IOHEXOL 300 MG/ML  SOLN
100.0000 mL | Freq: Once | INTRAMUSCULAR | Status: AC | PRN
  Administered 2012-09-05: 100 mL via INTRAVENOUS

## 2012-09-05 MED ORDER — PROMETHAZINE HCL 25 MG PO TABS: 25.0000 mg | ORAL_TABLET | Freq: Four times a day (QID) | ORAL | Status: DC | PRN

## 2012-09-05 NOTE — ED Notes (Signed)
Pt reports has had nausea for the past 3 weeks.  Reports has had some pain in LUQ.  This morning says felt like she was having heart palpitations.  Reports had an appt to see her PCP but they closed due to the snow.  Reports intermittent diarrhea.

## 2012-09-05 NOTE — ED Provider Notes (Signed)
History     This chart was scribed for Donnetta Hutching, MD, MD by Smitty Pluck, ED Scribe. The patient was seen in room APA15/APA15 and the patient's care was started at 1:33 PM.   CSN: [id number]  Arrival date & time 09/05/12  1213     Chief Complaint  Patient presents with  . Nausea     The history is provided by the patient. No language interpreter was used.   [name] is a 42 y.o. female with hx of HTN and depression who presents to the Emergency Department complaining of constant, moderate nausea onset 3 weeks ago that is worsening. She states she has pain at lateral aspect of RUQ and LUQ abdomen. She reports having mild diarrhea. She denies having cholecystomy. She reports that eating aggravates the pain. Denies hx of similar symptoms.  Pt denies radiation of pain, fever, chills, nausea, vomiting, diarrhea, weakness, cough, SOB and any other pain.   Pt goes to [facility].  Past Medical History  Diagnosis Date  . Anxiety   . Depression   . Hypertension   . Back pain, chronic     Past Surgical History  Procedure Laterality Date  . Appendectomy    . Tubal ligation      Family History  Problem Relation Age of Onset  . Arthritis    . Lung disease      History  Substance Use Topics  . Smoking status: Current Every Day Smoker  . Smokeless tobacco: Not on file  . Alcohol Use: No    OB History   Grav Para Term Preterm Abortions TAB SAB Ect Mult Living                  Review of Systems 10 Systems reviewed and all are negative for acute change except as noted in the HPI.   Allergies  Biaxin  Home Medications   Current Outpatient Rx  Name  Route  Sig  Dispense  Refill  . Aspirin-Acetaminophen-Caffeine (GOODYS EXTRA STRENGTH) 520-260-32.5 MG PACK   Oral   Take 1 packet by mouth 2 (two) times daily as needed (pain).         Marland Kitchen esomeprazole (NEXIUM) 40 MG capsule   Oral   Take 40 mg by mouth daily before breakfast.         .  ibuprofen (ADVIL,MOTRIN) 200 MG tablet   Oral   Take 600 mg by mouth every 8 (eight) hours as needed for pain.           BP 114/87  Pulse 102  Temp(Src) 98.4 F (36.9 C) (Oral)  Resp 20  Ht 5\' 2"  (1.575 m)  Wt 210 lb (95.255 kg)  BMI 38.4 kg/m2  SpO2 100%  Physical Exam  Nursing note and vitals reviewed. Constitutional: She is oriented to person, place, and time. She appears well-developed and well-nourished.  HENT:  Head: Normocephalic and atraumatic.  Eyes: Conjunctivae and EOM are normal. Pupils are equal, round, and reactive to light.  Neck: Normal range of motion. Neck supple.  Cardiovascular: Normal rate, regular rhythm and normal heart sounds.   Pulmonary/Chest: Effort normal and breath sounds normal.  Abdominal: Soft. Bowel sounds are normal. There is tenderness (lateral RUQ and LUQ).  Musculoskeletal: Normal range of motion.  Neurological: She is alert and oriented to person, place, and time.  Skin: Skin is warm and dry.  Psychiatric: She has a normal mood and affect.    ED Course  Procedures (  including critical care time) DIAGNOSTIC STUDIES: Oxygen Saturation is 100% on room air, normal by my interpretation.    COORDINATION OF CARE: 1:35 PM Discussed ED treatment with pt and pt agrees. (ultrasound of gallbladder, labs)    Labs Reviewed  COMPREHENSIVE METABOLIC PANEL - Abnormal; Notable for the following:    Glucose, Bld 103 (*)    Total Bilirubin 0.1 (*)    All other components within normal limits  CBC WITH DIFFERENTIAL - Abnormal; Notable for the following:    RBC 3.69 (*)    Hemoglobin 10.3 (*)    HCT 32.9 (*)    All other components within normal limits  URINALYSIS, ROUTINE W REFLEX MICROSCOPIC - Abnormal; Notable for the following:    Hgb urine dipstick TRACE (*)    All other components within normal limits  URINE MICROSCOPIC-ADD ON  PREGNANCY, URINE     No results found.   No diagnosis found.   Date: 09/05/2012  Rate: 84  Rhythm:  normal sinus rhythm  QRS Axis: normal  Intervals: normal  ST/T Wave abnormalities: normal  Conduction Disutrbances: none  Narrative Interpretation: unremarkable  US Abdomen Complete  09/05/2012  *RADIOLOGY REPORT*  Clinical Data:  Right upper quadrant abdominal pain and nausea which is worse with eating.  ABDOMEN ULTRASOUND  Technique:  Complete abdominal ultrasound examination was performed including evaluation of the liver, gallbladder, bile ducts, pancreas, kidneys, spleen, IVC, and abdominal aorta.  Comparison:  CT of the abdomen on 06/20/2003.  Findings:  Gallbladder:  The gallbladder is relatively contracted and demonstrates no evidence of calculi.  No pericholecystic fluid or sonographic Murphy's sign is identified. The gallbladder wall is somewhat prominent in appearance.  However, this may relate to relative contraction.  Common Bile Duct:  Normal caliber of 3 mm.  Liver:  The liver shows mildly increased echogenicity which may be consistent with steatosis.  No focal masses or evidence of biliary ductal dilatation.  IVC:  Patent throughout its visualized course in the abdomen.  Pancreas:  Although the pancreas is difficult to visualize in its entirety, no focal pancreatic abnormality is identified.  Spleen:  The spleen is of normal echotexture and size.  Kidneys:  The right kidney measures approximately 12.6 cm and the left kidney 12.3 cm.  Both show no evidence of hydronephrosis or focal lesion.  There is a cystic area appearing to emanate from the medial aspect of the left kidney measuring approximately 2 cm. This is likely a renal cyst and is not completely anechoic by ultrasound.  A cyst was not present in this region on the old abdominal CT.  Recommend elective evaluation with either abdominal CT or MRI.  Abdominal Aorta:  The visualized abdominal aorta is of normal caliber.  IMPRESSION:  1.  Relative contraction of the gallbladder without evidence of calculi or biliary obstruction. 2.   Possible complex cystic lesion of the left kidney which is not well characterized by ultrasound and measures approximately 2 cm. A cyst was not present in this region by prior CT in 2004. Elective imaging evaluation is recommended with either abdominal MRI or CT to exclude cystic neoplasm.  Either study should be performed with contrast.   Original Report Authenticated By: Irish Lack, M.D.    Ct Abdomen Pelvis W Contrast  09/05/2012  *RADIOLOGY REPORT*  Clinical Data: 42 year old female with abdominal pelvic pain and nausea.  CT ABDOMEN AND PELVIS WITH CONTRAST  Technique:  Multidetector CT imaging of the abdomen and pelvis was performed following the standard  protocol during bolus administration of intravenous contrast.  Contrast: OMNIPAQUE IOHEXOL 300 MG/ML  SOLN  Comparison: 09/05/2012 ultrasound and 06/20/2003 CT  Findings: The liver, adrenal glands, gallbladder, spleen, pancreas and kidneys are unremarkable except for a small right renal cyst. There is no evidence of solid renal mass.  No free fluid, enlarged lymph nodes, biliary dilation or abdominal aortic aneurysm identified.  The bowel and bladder are within normal limits.  The uterus and adnexal regions are unchanged with a 2 cm left fundal subserosal fibroid. No acute or suspicious bony abnormalities are identified.  IMPRESSION: No evidence of acute abnormality.  No evidence of solid renal mass or suspicious renal abnormality.  2 cm left uterine fibroid.   Original Report Authenticated By: Harmon Pier, M.D.      MDM  CT scan shows no acute anomalies.white count normal.  No acute abdomen at discharge. Discharge meds Percocet #20 Phenergan 25 mg #20. Patient has primary care followup      I personally performed the services described in this documentation, which was scribed in my presence. The recorded information has been reviewed and is accurate.    Donnetta Hutching, MD 09/05/12 2017

## 2012-11-19 ENCOUNTER — Encounter (HOSPITAL_COMMUNITY): Payer: Self-pay | Admitting: *Deleted

## 2012-11-19 ENCOUNTER — Emergency Department (HOSPITAL_COMMUNITY)
Admission: EM | Admit: 2012-11-19 | Discharge: 2012-11-19 | Disposition: A | Discharge status: Home / Self Care | Payer: Medicaid Other | Attending: Emergency Medicine | Admitting: Emergency Medicine

## 2012-11-19 ENCOUNTER — Emergency Department (HOSPITAL_COMMUNITY): Payer: Medicaid Other

## 2012-11-19 DIAGNOSIS — M549 Dorsalgia, unspecified: Secondary | ICD-10-CM

## 2012-11-19 DIAGNOSIS — R05 Cough: Secondary | ICD-10-CM | POA: Insufficient documentation

## 2012-11-19 DIAGNOSIS — Z8659 Personal history of other mental and behavioral disorders: Secondary | ICD-10-CM | POA: Insufficient documentation

## 2012-11-19 DIAGNOSIS — R110 Nausea: Secondary | ICD-10-CM | POA: Insufficient documentation

## 2012-11-19 DIAGNOSIS — R059 Cough, unspecified: Secondary | ICD-10-CM | POA: Insufficient documentation

## 2012-11-19 DIAGNOSIS — R300 Dysuria: Secondary | ICD-10-CM | POA: Insufficient documentation

## 2012-11-19 DIAGNOSIS — G8929 Other chronic pain: Secondary | ICD-10-CM | POA: Insufficient documentation

## 2012-11-19 DIAGNOSIS — Z3202 Encounter for pregnancy test, result negative: Secondary | ICD-10-CM | POA: Insufficient documentation

## 2012-11-19 DIAGNOSIS — F17200 Nicotine dependence, unspecified, uncomplicated: Secondary | ICD-10-CM | POA: Insufficient documentation

## 2012-11-19 DIAGNOSIS — Z9089 Acquired absence of other organs: Secondary | ICD-10-CM | POA: Insufficient documentation

## 2012-11-19 DIAGNOSIS — Z79899 Other long term (current) drug therapy: Secondary | ICD-10-CM | POA: Insufficient documentation

## 2012-11-19 DIAGNOSIS — I10 Essential (primary) hypertension: Secondary | ICD-10-CM | POA: Insufficient documentation

## 2012-11-19 DIAGNOSIS — Z9851 Tubal ligation status: Secondary | ICD-10-CM | POA: Insufficient documentation

## 2012-11-19 LAB — CBC WITH DIFFERENTIAL/PLATELET
Eosinophils Absolute: 0 10*3/uL (ref 0.0–0.7)
Eosinophils Relative: 1 % (ref 0–5)
HCT: 32.1 % — ABNORMAL LOW (ref 36.0–46.0)
Lymphocytes Relative: 17 % (ref 12–46)
Lymphs Abs: 1.4 10*3/uL (ref 0.7–4.0)
MCH: 26.2 pg (ref 26.0–34.0)
MCV: 84 fL (ref 78.0–100.0)
Monocytes Absolute: 1 10*3/uL (ref 0.1–1.0)
Platelets: 395 10*3/uL (ref 150–400)
RBC: 3.82 MIL/uL — ABNORMAL LOW (ref 3.87–5.11)

## 2012-11-19 LAB — URINE MICROSCOPIC-ADD ON

## 2012-11-19 LAB — COMPREHENSIVE METABOLIC PANEL
ALT: 8 U/L (ref 0–35)
BUN: 7 mg/dL (ref 6–23)
CO2: 24 mEq/L (ref 19–32)
Calcium: 8.9 mg/dL (ref 8.4–10.5)
Creatinine, Ser: 0.65 mg/dL (ref 0.50–1.10)
GFR calc Af Amer: >90 mL/min (ref >90)
GFR calc non Af Amer: >90 mL/min (ref >90)
Glucose, Bld: 94 mg/dL (ref 70–99)
Sodium: 137 mEq/L (ref 135–145)
Total Protein: 7.1 g/dL (ref 6.0–8.3)

## 2012-11-19 LAB — PREGNANCY, URINE: Preg Test, Ur: NEGATIVE

## 2012-11-19 LAB — URINALYSIS, ROUTINE W REFLEX MICROSCOPIC
Bilirubin Urine: NEGATIVE
Glucose, UA: NEGATIVE mg/dL
Protein, ur: NEGATIVE mg/dL
Specific Gravity, Urine: 1.01 (ref 1.005–1.030)
Urobilinogen, UA: 0.2 mg/dL (ref 0.0–1.0)

## 2012-11-19 MED ORDER — OXYCODONE-ACETAMINOPHEN 5-325 MG PO TABS: 1.0000 | ORAL_TABLET | Freq: Four times a day (QID) | ORAL | Status: DC | PRN

## 2012-11-19 MED ORDER — HYDROMORPHONE HCL PF 1 MG/ML IJ SOLN
1.0000 mg | Freq: Once | INTRAMUSCULAR | Status: AC
  Administered 2012-11-19: 1 mg via INTRAVENOUS
  Filled 2012-11-19: qty 1

## 2012-11-19 MED ORDER — ONDANSETRON HCL 4 MG/2ML IJ SOLN
4.0000 mg | Freq: Once | INTRAMUSCULAR | Status: AC
  Administered 2012-11-19: 4 mg via INTRAVENOUS
  Filled 2012-11-19: qty 2

## 2012-11-19 MED ORDER — CYCLOBENZAPRINE HCL 10 MG PO TABS: 10.0000 mg | ORAL_TABLET | Freq: Three times a day (TID) | ORAL | Status: DC | PRN

## 2012-11-19 NOTE — ED Notes (Signed)
Urine specimen taken to lab.

## 2012-11-19 NOTE — ED Provider Notes (Signed)
History  This chart was scribed for Kristina Lennert, MD by Shari Heritage, ED Scribe. The patient was seen in room APA09/APA09. Patient's care was started at 1523.  CSN: [id number]  Arrival date & time 11/19/12  1506   First MD Initiated Contact with Patient 11/19/12 1523      Chief Complaint  Patient presents with  . Flank Pain    Patient is a 42 y.o. female presenting with flank pain. The history is provided by the patient. No language interpreter was used.  Flank Pain This is a new problem. The current episode started yesterday. The problem occurs constantly. The problem has not changed since onset.Associated symptoms include abdominal pain. Pertinent negatives include no chest pain and no headaches. Exacerbated by: eating and moving. Nothing relieves the symptoms. She has tried nothing for the symptoms.    HPI Comments: [name] is a 42 y.o. female who presents to the Emergency Department complaining of constant, moderate to severe, flank pain that radiates to right lower quadrant that started yesterday. Pain is worse during and after eating and with movement. There is associated nausea and mild dysuria. Patient also reports a mild cough. She denies fever, sore throat, chills or any other symptoms at this time.. Pt has history of anxiety, depression, hypertension, and chronic back pain. Her surgical history includes appendectomy. Pt is an everyday tobacco user.   Past Medical History  Diagnosis Date  . Anxiety   . Depression   . Hypertension   . Back pain, chronic     Past Surgical History  Procedure Laterality Date  . Appendectomy    . Tubal ligation      Family History  Problem Relation Age of Onset  . Arthritis    . Lung disease      History  Substance Use Topics  . Smoking status: Current Every Day Smoker  . Smokeless tobacco: Not on file  . Alcohol Use: No    OB History   Grav Para Term Preterm Abortions TAB SAB Ect Mult Living                   Review of Systems  Constitutional: Negative for fever, chills, appetite change and fatigue.  HENT: Negative for congestion, sore throat, sinus pressure and ear discharge.   Eyes: Negative for discharge.  Respiratory: Positive for cough.   Cardiovascular: Negative for chest pain.  Gastrointestinal: Positive for abdominal pain. Negative for diarrhea.  Genitourinary: Positive for dysuria and flank pain. Negative for frequency and hematuria.  Musculoskeletal: Negative for back pain.  Skin: Negative for rash.  Neurological: Negative for seizures and headaches.  Psychiatric/Behavioral: Negative for hallucinations.    Allergies  Biaxin  Home Medications   Current Outpatient Rx  Name  Route  Sig  Dispense  Refill  . Aspirin-Acetaminophen-Caffeine (GOODYS EXTRA STRENGTH) 520-260-32.5 MG PACK   Oral   Take 1 packet by mouth 2 (two) times daily as needed (pain).         Marland Kitchen esomeprazole (NEXIUM) 40 MG capsule   Oral   Take 40 mg by mouth daily before breakfast.         . ibuprofen (ADVIL,MOTRIN) 200 MG tablet   Oral   Take 600 mg by mouth every 8 (eight) hours as needed for pain.         Marland Kitchen oxyCODONE-acetaminophen (PERCOCET) 5-325 MG per tablet   Oral   Take 2 tablets by mouth every 4 (four) hours as needed for pain.  20 tablet   0   . promethazine (PHENERGAN) 25 MG tablet   Oral   Take 1 tablet (25 mg total) by mouth every 6 (six) hours as needed for nausea.   20 tablet   0     Triage Vitals: BP 137/87  Pulse 107  Temp(Src) 98.2 F (36.8 C) (Oral)  Resp 20  Ht 5\' 2"  (1.575 m)  Wt 230 lb (104.327 kg)  BMI 42.06 kg/m2  SpO2 99%  LMP 11/12/2012  Physical Exam  Constitutional: She is oriented to person, place, and time. She appears well-developed and well-nourished.  HENT:  Head: Normocephalic.  Eyes: Conjunctivae and EOM are normal. No scleral icterus.  Neck: Neck supple. No thyromegaly present.  Cardiovascular: Normal rate and regular rhythm.  Exam  reveals no gallop and no friction rub.   No murmur heard. Pulmonary/Chest: No stridor. She has no wheezes. She has no rales. She exhibits no tenderness.  Abdominal: She exhibits no distension. There is tenderness. There is no rebound.  Moderate right flank tenderness.  Musculoskeletal: Normal range of motion. She exhibits no edema.  Lymphadenopathy:    She has no cervical adenopathy.  Neurological: She is oriented to person, place, and time. Coordination normal.  Skin: No rash noted. No erythema.  Psychiatric: She has a normal mood and affect. Her behavior is normal.    ED Course  Procedures (including critical care time) DIAGNOSTIC STUDIES: Oxygen Saturation is 99% on room air, normal by my interpretation.    COORDINATION OF CARE: 3:30 PM- Patient informed of current plan for treatment and evaluation and agrees with plan at this time.    Labs Reviewed  URINALYSIS, ROUTINE W REFLEX MICROSCOPIC  CBC WITH DIFFERENTIAL  COMPREHENSIVE METABOLIC PANEL    No results found.   No diagnosis found.    MDM  Back pain with neg ct, ua, and blood. Will treat for muscle pain      The chart was scribed for me under my direct supervision.  I personally performed the history, physical, and medical decision making and all procedures in the evaluation of this patient.Kristina Lennert, MD 11/19/12 [phone]

## 2012-11-19 NOTE — ED Notes (Signed)
Pt c/o right flank pain that radiates to right side area, denies any abd pain. Pain is worse with movement, denies any injury, reports "some" burning with urination, nausea. Pain started yesterday.

## 2012-11-19 NOTE — ED Notes (Signed)
Pt back from CT

## 2012-11-19 NOTE — ED Notes (Signed)
Pt taken by stretcher to CT.

## 2013-07-21 ENCOUNTER — Encounter (HOSPITAL_COMMUNITY): Payer: Self-pay | Admitting: Emergency Medicine

## 2013-07-21 ENCOUNTER — Emergency Department (HOSPITAL_COMMUNITY)
Admission: EM | Admit: 2013-07-21 | Discharge: 2013-07-21 | Disposition: A | Discharge status: Home / Self Care | Payer: Medicaid Other | Attending: Emergency Medicine | Admitting: Emergency Medicine

## 2013-07-21 DIAGNOSIS — B029 Zoster without complications: Secondary | ICD-10-CM | POA: Insufficient documentation

## 2013-07-21 DIAGNOSIS — Z8659 Personal history of other mental and behavioral disorders: Secondary | ICD-10-CM | POA: Insufficient documentation

## 2013-07-21 DIAGNOSIS — I10 Essential (primary) hypertension: Secondary | ICD-10-CM | POA: Insufficient documentation

## 2013-07-21 DIAGNOSIS — G8929 Other chronic pain: Secondary | ICD-10-CM | POA: Insufficient documentation

## 2013-07-21 DIAGNOSIS — M549 Dorsalgia, unspecified: Secondary | ICD-10-CM | POA: Insufficient documentation

## 2013-07-21 DIAGNOSIS — Z79899 Other long term (current) drug therapy: Secondary | ICD-10-CM | POA: Insufficient documentation

## 2013-07-21 DIAGNOSIS — F17200 Nicotine dependence, unspecified, uncomplicated: Secondary | ICD-10-CM | POA: Insufficient documentation

## 2013-07-21 MED ORDER — HYDROCODONE-ACETAMINOPHEN 5-325 MG PO TABS: 1.0000 | ORAL_TABLET | ORAL | Status: DC | PRN

## 2013-07-21 MED ORDER — FLUORESCEIN SODIUM 1 MG OP STRP
ORAL_STRIP | OPHTHALMIC | Status: AC
  Filled 2013-07-21: qty 2

## 2013-07-21 MED ORDER — TETRACAINE HCL 0.5 % OP SOLN
2.0000 [drp] | Freq: Once | OPHTHALMIC | Status: AC
Start: 2013-07-21 — End: 2013-07-21
  Administered 2013-07-21: 2 [drp] via OPHTHALMIC
  Filled 2013-07-21: qty 2

## 2013-07-21 MED ORDER — ACYCLOVIR 400 MG PO TABS: 400.0000 mg | ORAL_TABLET | Freq: Four times a day (QID) | ORAL | Status: DC

## 2013-07-21 NOTE — Discharge Instructions (Signed)
Shingles Shingles (herpes zoster) is an infection that is caused by the same virus that causes chickenpox (varicella). The infection causes a painful skin rash and fluid-filled blisters, which eventually break open, crust over, and heal. It may occur in any area of the body, but it usually affects only one side of the body or face. The pain of shingles usually lasts about 1 month. However, some people with shingles may develop long-term (chronic) pain in the affected area of the body. Shingles often occurs many years after the person had chickenpox. It is more common:  In people older than 50 years.  In people with weakened immune systems, such as those with HIV, AIDS, or cancer.  In people taking medicines that weaken the immune system, such as transplant medicines.  In people under great stress. CAUSES  Shingles is caused by the varicella zoster virus (VZV), which also causes chickenpox. After a person is infected with the virus, it can remain in the person's body for years in an inactive state (dormant). To cause shingles, the virus reactivates and breaks out as an infection in a nerve root. The virus can be spread from person to person (contagious) through contact with open blisters of the shingles rash. It will only spread to people who have not had chickenpox. When these people are exposed to the virus, they may develop chickenpox. They will not develop shingles. Once the blisters scab over, the person is no longer contagious and cannot spread the virus to others. SYMPTOMS  Shingles shows up in stages. The initial symptoms may be pain, itching, and tingling in an area of the skin. This pain is usually described as burning, stabbing, or throbbing.In a few days or weeks, a painful red rash will appear in the area where the pain, itching, and tingling were felt. The rash is usually on one side of the body in a band or belt-like pattern. Then, the rash usually turns into fluid-filled blisters. They  will scab over and dry up in approximately 2 3 weeks. Flu-like symptoms may also occur with the initial symptoms, the rash, or the blisters. These may include:  Fever.  Chills.  Headache.  Upset stomach. DIAGNOSIS  Your caregiver will perform a skin exam to diagnose shingles. Skin scrapings or fluid samples may also be taken from the blisters. This sample will be examined under a microscope or sent to a lab for further testing. TREATMENT  There is no specific cure for shingles. Your caregiver will likely prescribe medicines to help you manage the pain, recover faster, and avoid long-term problems. This may include antiviral drugs, anti-inflammatory drugs, and pain medicines. HOME CARE INSTRUCTIONS   Take a cool bath or apply cool compresses to the area of the rash or blisters as directed. This may help with the pain and itching.   Only take over-the-counter or prescription medicines as directed by your caregiver.   Rest as directed by your caregiver.  Keep your rash and blisters clean with mild soap and cool water or as directed by your caregiver.  Do not pick your blisters or scratch your rash. Apply an anti-itch cream or numbing creams to the affected area as directed by your caregiver.  Keep your shingles rash covered with a loose bandage (dressing).  Avoid skin contact with:  Babies.   Pregnant women.   Children with eczema.   Elderly people with transplants.   People with chronic illnesses, such as leukemia or AIDS.   Wear loose-fitting clothing to help ease   the pain of material rubbing against the rash.  Keep all follow-up appointments with your caregiver.If the area involved is on your face, you may receive a referral for follow-up to a specialist, such as an eye doctor (ophthalmologist) or an ear, nose, and throat (ENT) doctor. Keeping all follow-up appointments will help you avoid eye complications, chronic pain, or disability.  SEEK IMMEDIATE MEDICAL  CARE IF:   You have facial pain, pain around the eye area, or loss of feeling on one side of your face.  You have ear pain or ringing in your ear.  You have loss of taste.  Your pain is not relieved with prescribed medicines.   Your redness or swelling spreads.   You have more pain and swelling.  Your condition is worsening or has changed.   You have a feveror persistent symptoms for more than 2 3 days.  You have a fever and your symptoms suddenly get worse. MAKE SURE YOU:  Understand these instructions.  Will watch your condition.  Will get help right away if you are not doing well or get worse. Document Released: 06/28/2005 Document Revised: 03/22/2012 Document Reviewed: 02/10/2012 [employer] Patient Information 2014 [employer].  

## 2013-07-21 NOTE — ED Notes (Signed)
Pt c/o sores in nose recently and reports swelling to nose x 4 days.

## 2013-07-21 NOTE — ED Provider Notes (Signed)
Medical screening examination/treatment/procedure(s) were performed by non-physician practitioner and as supervising physician I was immediately available for consultation/collaboration.  EKG Interpretation   None         Evian Derringer L Ilsa Bonello, MD 07/21/13 1609 

## 2013-07-21 NOTE — ED Provider Notes (Signed)
CSN: [id number]     Arrival date & time 07/21/13  1054 History   First MD Initiated Contact with Patient 07/21/13 1159     Chief Complaint  Patient presents with  . sores in nose    (Consider location/radiation/quality/duration/timing/severity/associated sxs/prior Treatment) HPI Comments: Patient is 43 year old female who presents to the ED with about a 4 day history of burning and painful rash - she states that this initially started in her right nare, she had been using neosporin ointment in the nare without relief, then she noted that she had similar lesions on her upper lip and chin.  She reports these are painful, that she has a clear runny nose, and watery eyes as well - she has not tried anything else for this.  Patient is a 43 y.o. female presenting with rash. The history is provided by the patient. No language interpreter was used.  Rash Location:  Face Facial rash location:  Nose, lip and chin Quality: blistering, burning, painful and weeping   Quality: not draining, not dry, not red, not scaling and not swelling   Pain details:    Quality:  Burning   Severity:  Moderate   Onset quality:  Gradual   Duration:  4 days   Timing:  Constant   Progression:  Worsening Severity:  Moderate Onset quality:  Gradual Duration:  4 days Timing:  Constant Progression:  Worsening Chronicity:  New Relieved by:  Nothing Worsened by:  Nothing tried Ineffective treatments:  None tried Associated symptoms: no fatigue, no fever, no headaches, no induration, no myalgias, no periorbital edema, no shortness of breath, no sore throat, no tongue swelling and no URI     Past Medical History  Diagnosis Date  . Anxiety   . Depression   . Hypertension   . Back pain, chronic    Past Surgical History  Procedure Laterality Date  . Appendectomy    . Tubal ligation     Family History  Problem Relation Age of Onset  . Arthritis    . Lung disease     History  Substance Use Topics  . Smoking  status: Current Every Day Smoker  . Smokeless tobacco: Not on file  . Alcohol Use: No   OB History   Grav Para Term Preterm Abortions TAB SAB Ect Mult Living                 Review of Systems  Constitutional: Negative for fever and fatigue.  HENT: Negative for sore throat.   Respiratory: Negative for shortness of breath.   Musculoskeletal: Negative for myalgias.  Skin: Positive for rash.  Neurological: Negative for headaches.  All other systems reviewed and are negative.    Allergies  Biaxin  Home Medications   Current Outpatient Rx  Name  Route  Sig  Dispense  Refill  . Aspirin-Acetaminophen-Caffeine (GOODYS EXTRA STRENGTH) 520-260-32.5 MG PACK   Oral   Take 1 packet by mouth 2 (two) times daily as needed (pain).         Marland Kitchen. ibuprofen (ADVIL,MOTRIN) 200 MG tablet   Oral   Take 400-800 mg by mouth every 8 (eight) hours as needed for moderate pain.          Marland Kitchen. esomeprazole (NEXIUM) 40 MG capsule   Oral   Take 40 mg by mouth daily before breakfast.          BP 143/100  Pulse 106  Temp(Src) 98.3 F (36.8 C) (Oral)  Ht 5' (1.524  m)  Wt 220 lb (99.791 kg)  BMI 42.97 kg/m2  SpO2 100%  LMP 07/14/2013 Physical Exam  Nursing note and vitals reviewed. Constitutional: She is oriented to person, place, and time. She appears well-developed and well-nourished. No distress.  HENT:  Head: Atraumatic.  Right Ear: External ear normal.  Left Ear: External ear normal.  Mouth/Throat: Oropharynx is clear and moist. No oropharyngeal exudate.  No intranasal abscess noted, vesicular rash noted to external nare, upper lip, chin  Eyes: Conjunctivae and EOM are normal. Pupils are equal, round, and reactive to light. Right eye exhibits no discharge. Left eye exhibits no discharge.  Bilateral eyes stained, no fluorescein dye uptake, vision normal.  Neck: Normal range of motion. Neck supple.  Pulmonary/Chest: Effort normal.  Musculoskeletal: Normal range of motion.   Lymphadenopathy:    She has no cervical adenopathy.  Neurological: She is alert and oriented to person, place, and time. She exhibits normal muscle tone. Coordination normal.  Skin: Skin is warm and dry. Rash noted. No erythema. No pallor.  Psychiatric: She has a normal mood and affect. Her behavior is normal. Judgment and thought content normal.    ED Course  Procedures (including critical care time) Labs Review Labs Reviewed - No data to display Imaging Review No results found.  EKG Interpretation   None       MDM  Shingles  Patient here with painful lesions to face and nose, no evidence of herpes keratitis, will start on anti-virals and pain control.   Izola Price Marisue Humble, PA-C 07/21/13 1314

## 2013-12-06 ENCOUNTER — Encounter (HOSPITAL_COMMUNITY): Payer: Self-pay | Admitting: Emergency Medicine

## 2013-12-06 ENCOUNTER — Emergency Department (HOSPITAL_COMMUNITY)
Admission: EM | Admit: 2013-12-06 | Discharge: 2013-12-06 | Disposition: A | Discharge status: Home / Self Care | Payer: Self-pay | Attending: Emergency Medicine | Admitting: Emergency Medicine

## 2013-12-06 ENCOUNTER — Emergency Department (HOSPITAL_COMMUNITY): Payer: Self-pay

## 2013-12-06 DIAGNOSIS — M549 Dorsalgia, unspecified: Secondary | ICD-10-CM | POA: Insufficient documentation

## 2013-12-06 DIAGNOSIS — F3289 Other specified depressive episodes: Secondary | ICD-10-CM | POA: Insufficient documentation

## 2013-12-06 DIAGNOSIS — N390 Urinary tract infection, site not specified: Secondary | ICD-10-CM | POA: Insufficient documentation

## 2013-12-06 DIAGNOSIS — Z79899 Other long term (current) drug therapy: Secondary | ICD-10-CM | POA: Insufficient documentation

## 2013-12-06 DIAGNOSIS — F411 Generalized anxiety disorder: Secondary | ICD-10-CM | POA: Insufficient documentation

## 2013-12-06 DIAGNOSIS — Z3202 Encounter for pregnancy test, result negative: Secondary | ICD-10-CM | POA: Insufficient documentation

## 2013-12-06 DIAGNOSIS — G8929 Other chronic pain: Secondary | ICD-10-CM | POA: Insufficient documentation

## 2013-12-06 DIAGNOSIS — F329 Major depressive disorder, single episode, unspecified: Secondary | ICD-10-CM | POA: Insufficient documentation

## 2013-12-06 DIAGNOSIS — I10 Essential (primary) hypertension: Secondary | ICD-10-CM | POA: Insufficient documentation

## 2013-12-06 DIAGNOSIS — F17200 Nicotine dependence, unspecified, uncomplicated: Secondary | ICD-10-CM | POA: Insufficient documentation

## 2013-12-06 LAB — CBC WITH DIFFERENTIAL/PLATELET
BASOS PCT: 1 % (ref 0–1)
Basophils Absolute: 0 10*3/uL (ref 0.0–0.1)
Eosinophils Absolute: 0.1 10*3/uL (ref 0.0–0.7)
Eosinophils Relative: 2 % (ref 0–5)
HEMATOCRIT: 37.1 % (ref 36.0–46.0)
HEMOGLOBIN: 11.5 g/dL — AB (ref 12.0–15.0)
LYMPHS ABS: 1.6 10*3/uL (ref 0.7–4.0)
Lymphocytes Relative: 20 % (ref 12–46)
MCH: 28.6 pg (ref 26.0–34.0)
MCHC: 31 g/dL (ref 30.0–36.0)
MCV: 92.3 fL (ref 78.0–100.0)
MONO ABS: 0.8 10*3/uL (ref 0.1–1.0)
Monocytes Relative: 9 % (ref 3–12)
NEUTROS ABS: 5.7 10*3/uL (ref 1.7–7.7)
Neutrophils Relative %: 68 % (ref 43–77)
Platelets: 390 10*3/uL (ref 150–400)
RBC: 4.02 MIL/uL (ref 3.87–5.11)
RDW: 15.5 % (ref 11.5–15.5)
WBC: 8.3 10*3/uL (ref 4.0–10.5)

## 2013-12-06 LAB — PREGNANCY, URINE: PREG TEST UR: NEGATIVE

## 2013-12-06 LAB — BASIC METABOLIC PANEL
BUN: 6 mg/dL (ref 6–23)
CO2: 26 mEq/L (ref 19–32)
Calcium: 9 mg/dL (ref 8.4–10.5)
Chloride: 99 mEq/L (ref 96–112)
Creatinine, Ser: 0.65 mg/dL (ref 0.50–1.10)
GFR calc Af Amer: >90 mL/min (ref >90)
GFR calc non Af Amer: >90 mL/min (ref >90)
GLUCOSE: 96 mg/dL (ref 70–99)
POTASSIUM: 3.5 meq/L — AB (ref 3.7–5.3)
SODIUM: 140 meq/L (ref 137–147)

## 2013-12-06 LAB — URINALYSIS, ROUTINE W REFLEX MICROSCOPIC
Bilirubin Urine: NEGATIVE
Glucose, UA: NEGATIVE mg/dL
KETONES UR: NEGATIVE mg/dL
NITRITE: POSITIVE — AB
PH: 6 (ref 5.0–8.0)
Protein, ur: NEGATIVE mg/dL
Urobilinogen, UA: 0.2 mg/dL (ref 0.0–1.0)

## 2013-12-06 LAB — URINE MICROSCOPIC-ADD ON

## 2013-12-06 MED ORDER — IBUPROFEN 800 MG PO TABS: 800.0000 mg | ORAL_TABLET | Freq: Three times a day (TID) | ORAL | Status: DC | PRN

## 2013-12-06 MED ORDER — KETOROLAC TROMETHAMINE 30 MG/ML IJ SOLN
30.0000 mg | Freq: Once | INTRAMUSCULAR | Status: AC
Start: 2013-12-06 — End: 2013-12-06
  Administered 2013-12-06: 30 mg via INTRAVENOUS
  Filled 2013-12-06: qty 1

## 2013-12-06 MED ORDER — OXYCODONE-ACETAMINOPHEN 5-325 MG PO TABS
1.0000 | ORAL_TABLET | ORAL | Status: DC | PRN
Start: 2013-12-06 — End: 2016-01-17

## 2013-12-06 MED ORDER — ONDANSETRON HCL 4 MG/2ML IJ SOLN
4.0000 mg | Freq: Once | INTRAMUSCULAR | Status: AC
  Administered 2013-12-06: 4 mg via INTRAVENOUS
  Filled 2013-12-06: qty 2

## 2013-12-06 MED ORDER — HYDROMORPHONE HCL PF 1 MG/ML IJ SOLN
1.0000 mg | Freq: Once | INTRAMUSCULAR | Status: AC
  Administered 2013-12-06: 1 mg via INTRAVENOUS
  Filled 2013-12-06: qty 1

## 2013-12-06 MED ORDER — CEPHALEXIN 500 MG PO CAPS: 500.0000 mg | ORAL_CAPSULE | Freq: Four times a day (QID) | ORAL | Status: DC

## 2013-12-06 MED ORDER — MORPHINE SULFATE 4 MG/ML IJ SOLN
4.0000 mg | Freq: Once | INTRAMUSCULAR | Status: AC
  Administered 2013-12-06: 4 mg via INTRAVENOUS
  Filled 2013-12-06: qty 1

## 2013-12-06 NOTE — Care Management Utilization Note (Signed)
ED/CM noted patient did not have health insurance and/or PCP listed in the computer.  Patient was given the [place] resource handout with information on the clinics, food pantries, and the handout for new health insurance sign-up.  Patient expressed appreciation for information received. 

## 2013-12-06 NOTE — Discharge Instructions (Signed)
Urinary Tract Infection  Urinary tract infections (UTIs) can develop anywhere along your urinary tract. Your urinary tract is your body's drainage system for removing wastes and extra water. Your urinary tract includes two kidneys, two ureters, a bladder, and a urethra. Your kidneys are a pair of bean-shaped organs. Each kidney is about the size of your fist. They are located below your ribs, one on each side of your spine.  CAUSES  Infections are caused by microbes, which are microscopic organisms, including fungi, viruses, and bacteria. These organisms are so small that they can only be seen through a microscope. Bacteria are the microbes that most commonly cause UTIs.  SYMPTOMS   Symptoms of UTIs may vary by age and gender of the patient and by the location of the infection. Symptoms in young women typically include a frequent and intense urge to urinate and a painful, burning feeling in the bladder or urethra during urination. Older women and men are more likely to be tired, shaky, and weak and have muscle aches and abdominal pain. A fever may mean the infection is in your kidneys. Other symptoms of a kidney infection include pain in your back or sides below the ribs, nausea, and vomiting.  DIAGNOSIS  To diagnose a UTI, your caregiver will ask you about your symptoms. Your caregiver also will ask to provide a urine sample. The urine sample will be tested for bacteria and white blood cells. White blood cells are made by your body to help fight infection.  TREATMENT   Typically, UTIs can be treated with medication. Because most UTIs are caused by a bacterial infection, they usually can be treated with the use of antibiotics. The choice of antibiotic and length of treatment depend on your symptoms and the type of bacteria causing your infection.  HOME CARE INSTRUCTIONS   If you were prescribed antibiotics, take them exactly as your caregiver instructs you. Finish the medication even if you feel better after you  have only taken some of the medication.   Drink enough water and fluids to keep your urine clear or pale yellow.   Avoid caffeine, tea, and carbonated beverages. They tend to irritate your bladder.   Empty your bladder often. Avoid holding urine for long periods of time.   Empty your bladder before and after sexual intercourse.   After a bowel movement, women should cleanse from front to back. Use each tissue only once.  SEEK MEDICAL CARE IF:    You have back pain.   You develop a fever.   Your symptoms do not begin to resolve within 3 days.  SEEK IMMEDIATE MEDICAL CARE IF:    You have severe back pain or lower abdominal pain.   You develop chills.   You have nausea or vomiting.   You have continued burning or discomfort with urination.  MAKE SURE YOU:    Understand these instructions.   Will watch your condition.   Will get help right away if you are not doing well or get worse.  Document Released: 04/07/2005 Document Revised: 12/28/2011 Document Reviewed: 08/06/2011  [employer] Patient Information 2014 [employer].

## 2013-12-06 NOTE — ED Notes (Signed)
Complain of pain in right flank area and nausea

## 2013-12-06 NOTE — ED Provider Notes (Signed)
CSN: [id number]     Arrival date & time 12/06/13  0100 History   First MD Initiated Contact with Patient 12/06/13 0848     Chief Complaint  Patient presents with  . Flank Pain     (Consider location/radiation/quality/duration/timing/severity/associated sxs/prior Treatment) HPI Comments: [name] is a 43 y.o. Female presenting with a one day history of right sided flank pain, nausea and dysuria with increased urinary frequency which woke her yesterday morning.  She denies fevers or chills, vomiting, diarrhea and abdominal pain.  Her flank pain is constant, sharp and worsened with movement, although cannot find a position which relieves the pain.  She denies history of kidney stones and denies history of back injury.  She does have a history of chronic low back pain but her pain today is worsened and higher than her normal pain.  She has taken ibuprofen without relief of pain.     The history is provided by the patient.    Past Medical History  Diagnosis Date  . Anxiety   . Depression   . Hypertension   . Back pain, chronic    Past Surgical History  Procedure Laterality Date  . Appendectomy    . Tubal ligation     Family History  Problem Relation Age of Onset  . Arthritis    . Lung disease     History  Substance Use Topics  . Smoking status: Current Every Day Smoker  . Smokeless tobacco: Not on file  . Alcohol Use: No   OB History   Grav Para Term Preterm Abortions TAB SAB Ect Mult Living                 Review of Systems  Constitutional: Negative for fever.  HENT: Negative for congestion and sore throat.   Eyes: Negative.   Respiratory: Negative for chest tightness and shortness of breath.   Cardiovascular: Negative for chest pain.  Gastrointestinal: Positive for nausea. Negative for vomiting, abdominal pain and diarrhea.       Right flank pain.  Genitourinary: Positive for dysuria and frequency. Negative for vaginal discharge.  Musculoskeletal: Negative  for arthralgias, joint swelling and neck pain.  Skin: Negative.  Negative for rash and wound.  Neurological: Negative for dizziness, weakness, light-headedness, numbness and headaches.  Psychiatric/Behavioral: Negative.       Allergies  Biaxin  Home Medications   Prior to Admission medications   Medication Sig Start Date End Date Taking? Authorizing Provider  acyclovir (ZOVIRAX) 400 MG tablet Take 1 tablet (400 mg total) by mouth 4 (four) times daily. 07/21/13   Izola Price. Sanford, PA-C  Aspirin-Acetaminophen-Caffeine (GOODYS EXTRA STRENGTH) 520-260-32.5 MG PACK Take 1 packet by mouth 2 (two) times daily as needed (pain).    Historical Provider, MD  esomeprazole (NEXIUM) 40 MG capsule Take 40 mg by mouth daily before breakfast.    Historical Provider, MD  HYDROcodone-acetaminophen (NORCO/VICODIN) 5-325 MG per tablet Take 1 tablet by mouth every 4 (four) hours as needed. 07/21/13   Izola Price. Sanford, PA-C  ibuprofen (ADVIL,MOTRIN) 200 MG tablet Take 400-800 mg by mouth every 8 (eight) hours as needed for moderate pain.     Historical Provider, MD   BP 123/91  Pulse 80  Temp(Src) 97.7 F (36.5 C)  Resp 18  Ht 5\' 1"  (1.549 m)  Wt 230 lb (104.327 kg)  BMI 43.48 kg/m2  SpO2 96%  LMP 11/29/2013 Physical Exam  Nursing note and vitals reviewed. Constitutional: She appears well-developed and well-nourished.  Appears uncomfortable  HENT:  Head: Normocephalic and atraumatic.  Eyes: Conjunctivae are normal.  Neck: Normal range of motion.  Cardiovascular: Normal rate, regular rhythm, normal heart sounds and intact distal pulses.   Pulmonary/Chest: Effort normal and breath sounds normal. She has no wheezes.  Abdominal: Soft. Bowel sounds are normal. She exhibits no distension. There is no tenderness. There is CVA tenderness. There is no rebound and no guarding.  Musculoskeletal: Normal range of motion.  Neurological: She is alert.  Skin: Skin is warm and dry.  Psychiatric: She has a  normal mood and affect.    ED Course  Procedures (including critical care time) Labs Review Labs Reviewed  URINALYSIS, ROUTINE W REFLEX MICROSCOPIC - Abnormal; Notable for the following:    Specific Gravity, Urine >1.030 (*)    Hgb urine dipstick SMALL (*)    Nitrite POSITIVE (*)    Leukocytes, UA TRACE (*)    All other components within normal limits  URINE MICROSCOPIC-ADD ON - Abnormal; Notable for the following:    Squamous Epithelial / LPF FEW (*)    Bacteria, UA MANY (*)    All other components within normal limits  BASIC METABOLIC PANEL - Abnormal; Notable for the following:    Potassium 3.5 (*)    All other components within normal limits  CBC WITH DIFFERENTIAL - Abnormal; Notable for the following:    Hemoglobin 11.5 (*)    All other components within normal limits  URINE CULTURE  PREGNANCY, URINE    Imaging Review Ct Abdomen Pelvis Wo Contrast  12/06/2013   CLINICAL DATA:  Right flank pain, back pain, nausea.  EXAM: CT ABDOMEN AND PELVIS WITHOUT CONTRAST  TECHNIQUE: Multidetector CT imaging of the abdomen and pelvis was performed following the standard protocol without IV contrast.  COMPARISON:  Of 11/19/2012  FINDINGS: Lung bases are clear.  No effusions.  Heart is normal size.  Liver, gallbladder, spleen, pancreas, adrenals and kidneys are unremarkable. No renal or ureteral stones. No hydronephrosis.  Prior appendectomy. Small calcification within the fundus of the uterus likely reflects partially calcified fibroid. Uterus, and adnexa and urinary bladder otherwise unremarkable. No free fluid, free air or adenopathy. Stomach, large and small bowel are unremarkable.  No acute bony abnormality or focal bone lesion.  IMPRESSION: No renal or ureteral stones.  No hydronephrosis.  No acute findings.   Electronically Signed   By: Charlett NoseKevin  Dover M.D.   On: 12/06/2013 10:52     EKG Interpretation None      MDM   Final diagnoses:  UTI (lower urinary tract infection)  Back pain     Labs reviewed.  Urinary infection.  Pt sent for Ct to assess for ureteral stone vs pyelo. Pt given rocephin 1 gram IV.  Urine cx ordered.    Pt continued to have flank pain, and received temporary improvement with IV morphine, but much better relief with toradol.  CT scan is negative for stones.  Hx suggestive of a stone or ureteral spasm given degree of pain and response to toradol.  She was prescribed keflex, ibuprofen and oxycodone.  Encouraged warm compresses to lower back.  F/u with pcp if sx persist,  Return here if sx worsen including worse pain, fever or vomiting.  The patient appears reasonably screened and/or stabilized for discharge and I doubt any other medical condition or other [facility] requiring further screening, evaluation, or treatment in the ED at this time prior to discharge.     Burgess AmorJulie Tineshia Becraft, PA-C 12/06/13 1726

## 2013-12-07 LAB — URINE CULTURE: Colony Count: >=100000

## 2013-12-07 NOTE — ED Provider Notes (Signed)
Medical screening examination/treatment/procedure(s) were performed by non-physician practitioner and as supervising physician I was immediately available for consultation/collaboration.   EKG Interpretation None        Bronsen Serano, MD 12/07/13 1541 

## 2013-12-10 ENCOUNTER — Emergency Department (HOSPITAL_COMMUNITY)
Admission: EM | Admit: 2013-12-10 | Discharge: 2013-12-10 | Disposition: A | Discharge status: Home / Self Care | Payer: Self-pay | Attending: Emergency Medicine | Admitting: Emergency Medicine

## 2013-12-10 ENCOUNTER — Encounter (HOSPITAL_COMMUNITY): Payer: Self-pay | Admitting: Emergency Medicine

## 2013-12-10 ENCOUNTER — Emergency Department (HOSPITAL_COMMUNITY): Payer: Self-pay

## 2013-12-10 DIAGNOSIS — Z792 Long term (current) use of antibiotics: Secondary | ICD-10-CM | POA: Insufficient documentation

## 2013-12-10 DIAGNOSIS — Z9851 Tubal ligation status: Secondary | ICD-10-CM | POA: Insufficient documentation

## 2013-12-10 DIAGNOSIS — F411 Generalized anxiety disorder: Secondary | ICD-10-CM | POA: Insufficient documentation

## 2013-12-10 DIAGNOSIS — F17200 Nicotine dependence, unspecified, uncomplicated: Secondary | ICD-10-CM | POA: Insufficient documentation

## 2013-12-10 DIAGNOSIS — Z7982 Long term (current) use of aspirin: Secondary | ICD-10-CM | POA: Insufficient documentation

## 2013-12-10 DIAGNOSIS — I10 Essential (primary) hypertension: Secondary | ICD-10-CM | POA: Insufficient documentation

## 2013-12-10 DIAGNOSIS — F329 Major depressive disorder, single episode, unspecified: Secondary | ICD-10-CM | POA: Insufficient documentation

## 2013-12-10 DIAGNOSIS — G8929 Other chronic pain: Secondary | ICD-10-CM | POA: Insufficient documentation

## 2013-12-10 DIAGNOSIS — F3289 Other specified depressive episodes: Secondary | ICD-10-CM | POA: Insufficient documentation

## 2013-12-10 DIAGNOSIS — R000 Tachycardia, unspecified: Secondary | ICD-10-CM | POA: Insufficient documentation

## 2013-12-10 DIAGNOSIS — Z9089 Acquired absence of other organs: Secondary | ICD-10-CM | POA: Insufficient documentation

## 2013-12-10 DIAGNOSIS — R109 Unspecified abdominal pain: Secondary | ICD-10-CM | POA: Insufficient documentation

## 2013-12-10 DIAGNOSIS — Z79899 Other long term (current) drug therapy: Secondary | ICD-10-CM | POA: Insufficient documentation

## 2013-12-10 DIAGNOSIS — Z791 Long term (current) use of non-steroidal anti-inflammatories (NSAID): Secondary | ICD-10-CM | POA: Insufficient documentation

## 2013-12-10 LAB — CBC WITH DIFFERENTIAL/PLATELET
BASOS ABS: 0 10*3/uL (ref 0.0–0.1)
Basophils Relative: 1 % (ref 0–1)
EOS ABS: 0.1 10*3/uL (ref 0.0–0.7)
Eosinophils Relative: 1 % (ref 0–5)
HCT: 41.1 % (ref 36.0–46.0)
Hemoglobin: 12.8 g/dL (ref 12.0–15.0)
Lymphocytes Relative: 18 % (ref 12–46)
Lymphs Abs: 1.2 10*3/uL (ref 0.7–4.0)
MCH: 28.9 pg (ref 26.0–34.0)
MCHC: 31.1 g/dL (ref 30.0–36.0)
MCV: 92.8 fL (ref 78.0–100.0)
MONOS PCT: 7 % (ref 3–12)
Monocytes Absolute: 0.5 10*3/uL (ref 0.1–1.0)
NEUTROS PCT: 73 % (ref 43–77)
Neutro Abs: 5.1 10*3/uL (ref 1.7–7.7)
Platelets: 446 10*3/uL — ABNORMAL HIGH (ref 150–400)
RBC: 4.43 MIL/uL (ref 3.87–5.11)
RDW: 15.3 % (ref 11.5–15.5)
WBC: 6.9 10*3/uL (ref 4.0–10.5)

## 2013-12-10 LAB — URINALYSIS, ROUTINE W REFLEX MICROSCOPIC
Bilirubin Urine: NEGATIVE
Glucose, UA: NEGATIVE mg/dL
KETONES UR: 40 mg/dL — AB
NITRITE: NEGATIVE
PH: 6.5 (ref 5.0–8.0)
Specific Gravity, Urine: 1.02 (ref 1.005–1.030)
UROBILINOGEN UA: 0.2 mg/dL (ref 0.0–1.0)

## 2013-12-10 LAB — COMPREHENSIVE METABOLIC PANEL
ALBUMIN: 4.1 g/dL (ref 3.5–5.2)
ALK PHOS: 107 U/L (ref 39–117)
ALT: 12 U/L (ref 0–35)
AST: 20 U/L (ref 0–37)
BILIRUBIN TOTAL: 0.4 mg/dL (ref 0.3–1.2)
BUN: 7 mg/dL (ref 6–23)
CHLORIDE: 100 meq/L (ref 96–112)
CO2: 25 mEq/L (ref 19–32)
Calcium: 9.5 mg/dL (ref 8.4–10.5)
Creatinine, Ser: 0.52 mg/dL (ref 0.50–1.10)
GFR calc Af Amer: >90 mL/min (ref >90)
GFR calc non Af Amer: >90 mL/min (ref >90)
Glucose, Bld: 102 mg/dL — ABNORMAL HIGH (ref 70–99)
POTASSIUM: 4.4 meq/L (ref 3.7–5.3)
Sodium: 140 mEq/L (ref 137–147)
TOTAL PROTEIN: 8.6 g/dL — AB (ref 6.0–8.3)

## 2013-12-10 LAB — LIPASE, BLOOD: LIPASE: 19 U/L (ref 11–59)

## 2013-12-10 LAB — URINE MICROSCOPIC-ADD ON

## 2013-12-10 MED ORDER — HYDROMORPHONE HCL PF 1 MG/ML IJ SOLN
1.0000 mg | Freq: Once | INTRAMUSCULAR | Status: AC
  Administered 2013-12-10: 1 mg via INTRAVENOUS
  Filled 2013-12-10: qty 1

## 2013-12-10 MED ORDER — ONDANSETRON HCL 4 MG/2ML IJ SOLN
4.0000 mg | Freq: Once | INTRAMUSCULAR | Status: AC
  Administered 2013-12-10: 4 mg via INTRAVENOUS
  Filled 2013-12-10: qty 2

## 2013-12-10 MED ORDER — OXYCODONE-ACETAMINOPHEN 5-325 MG PO TABS: 2.0000 | ORAL_TABLET | ORAL | Status: DC | PRN

## 2013-12-10 MED ORDER — SODIUM CHLORIDE 0.9 % IV BOLUS (SEPSIS)
2000.0000 mL | Freq: Once | INTRAVENOUS | Status: AC
  Administered 2013-12-10: 2000 mL via INTRAVENOUS

## 2013-12-10 MED ORDER — SODIUM CHLORIDE 0.9 % IV BOLUS (SEPSIS)
1000.0000 mL | Freq: Once | INTRAVENOUS | Status: AC
  Administered 2013-12-10: 1000 mL via INTRAVENOUS

## 2013-12-10 NOTE — ED Notes (Signed)
Pt dx on Thursday with UTI and almost finished antibiotics with 2 doses left, pt states back pain has gotten worse, + nausea, denies vomiting

## 2013-12-10 NOTE — Discharge Instructions (Signed)
SEEK IMMEDIATE MEDICAL ATTENTION IF: New numbness, tingling, weakness, or problem with the use of your arms or legs.  Severe back pain not relieved with medications.  Change in bowel or bladder control.  Increasing pain in any areas of the body (such as chest or abdominal pain).  Shortness of breath, dizziness or fainting.  Nausea (feeling sick to your stomach), vomiting, fever, or sweats.  Abdominal (belly) pain and flank paincan be caused by many things. Your caregiver performed an examination and possibly ordered blood/urine tests and imaging (CT scan, x-rays, ultrasound). Many cases can be observed and treated at home after initial evaluation in the emergency department. Even though you are being discharged home, abdominal pain can be unpredictable. Therefore, you need a repeated exam if your pain does not resolve, returns, or worsens. Most patients with abdominal pain don't have to be admitted to the hospital or have surgery, but serious problems like appendicitis and gallbladder attacks can start out as nonspecific pain. Many abdominal conditions cannot be diagnosed in one visit, so follow-up evaluations are very important. SEEK IMMEDIATE MEDICAL ATTENTION IF: The pain does not go away or becomes severe.  A temperature above 101 develops.  Repeated vomiting occurs (multiple episodes).  The pain becomes localized to portions of the abdomen. The right side could possibly be appendicitis. In an adult, the left lower portion of the abdomen could be colitis or diverticulitis.  Blood is being passed in stools or vomit (bright red or black tarry stools).  Return also if you develop chest pain, difficulty breathing, dizziness or fainting, or become confused, poorly responsive, or inconsolable (young children).

## 2013-12-10 NOTE — ED Provider Notes (Signed)
CSN: [id number]     Arrival date & time 12/10/13  1611 History  This chart was scribed for Hurman Horn, MD by Nicholos Johns, ED scribe. This patient was seen in room [facility]04/[facility] and the patient's care was started at 4:25 PM.    Chief Complaint  Patient presents with  . Back Pain    The history is provided by the patient. No language interpreter was used.   HPI Comments: [name] is a 43 y.o. female w/ hx of chronic back pain presents to the Emergency Department complaining of gradually worsening constant non-radiating lower back pain; onset 7 days ago. Worse with movement and touch. Different and worse than chronic back pain. Also reports some associated nausea. No weakness or numbness to the legs. No bowel or bladder change with symptom onset. Diagnosed with suspected kidney infection 4 day ago; prescribed antibiotics which she has been compliant with. States this has provided no relief. Also prescribed Percocet for pain which has been providing no relief. CAT scan performed within the last week showed no evidence of kidney stone. Negative pregnancy test at visit 4 days ago. No IV drug use. Denies abdominal pain, vaginal bleeding, vaginal discharge, vomiting, diarreha, chest pain, cough, SOB, or fever.   Past Medical History  Diagnosis Date  . Anxiety   . Depression   . Hypertension   . Back pain, chronic    Past Surgical History  Procedure Laterality Date  . Appendectomy    . Tubal ligation     Family History  Problem Relation Age of Onset  . Arthritis    . Lung disease     History  Substance Use Topics  . Smoking status: Current Every Day Smoker  . Smokeless tobacco: Not on file  . Alcohol Use: No   OB History   Grav Para Term Preterm Abortions TAB SAB Ect Mult Living                 Review of Systems  10 Systems reviewed and all are negative for acute change except as noted in the HPI.  Allergies  Biaxin  Home Medications   Prior to Admission  medications   Medication Sig Start Date End Date Taking? Authorizing Provider  Aspirin-Acetaminophen-Caffeine (GOODYS EXTRA STRENGTH) 520-260-32.5 MG PACK Take 1 packet by mouth 2 (two) times daily as needed (pain).   Yes Historical Provider, MD  cephALEXin (KEFLEX) 500 MG capsule Take 1 capsule (500 mg total) by mouth 4 (four) times daily. 12/06/13  Yes Raynelle Fanning Idol, PA-C  escitalopram (LEXAPRO) 20 MG tablet Take 20 mg by mouth daily.   Yes Historical Provider, MD  esomeprazole (NEXIUM) 40 MG capsule Take 40 mg by mouth daily before breakfast.   Yes Historical Provider, MD  ibuprofen (ADVIL,MOTRIN) 200 MG tablet Take 400-800 mg by mouth every 8 (eight) hours as needed for moderate pain.    Yes Historical Provider, MD  ibuprofen (ADVIL,MOTRIN) 800 MG tablet Take 1 tablet (800 mg total) by mouth every 8 (eight) hours as needed for moderate pain. 12/06/13  Yes Raynelle Fanning Idol, PA-C  lisinopril (PRINIVIL,ZESTRIL) 20 MG tablet Take 20 mg by mouth daily.   Yes Historical Provider, MD  loratadine (CLARITIN) 10 MG tablet Take 10 mg by mouth daily.   Yes Historical Provider, MD  Multiple Vitamins-Minerals (MULTIVITAMINS THER. W/MINERALS) TABS tablet Take 1 tablet by mouth daily.   Yes Historical Provider, MD  oxyCODONE-acetaminophen (PERCOCET/ROXICET) 5-325 MG per tablet Take 1 tablet by mouth every 4 (four)  hours as needed for severe pain. 12/06/13  Yes Burgess Amor, PA-C  oxyCODONE-acetaminophen (PERCOCET) 5-325 MG per tablet Take 2 tablets by mouth every 4 (four) hours as needed. 12/10/13   Hurman Horn, MD   Triage vitals: BP 147/113  Pulse 116  Temp(Src) 97.9 F (36.6 C) (Oral)  Resp 20  Ht 5\' 1"  (1.549 m)  Wt 230 lb (104.327 kg)  BMI 43.48 kg/m2  SpO2 100%  LMP 11/29/2013  Physical Exam  Nursing note and vitals reviewed. Constitutional:  Awake, alert, nontoxic appearance.  HENT:  Head: Atraumatic.  Eyes: Right eye exhibits no discharge. Left eye exhibits no discharge.  Neck: Neck supple.   Cardiovascular: Regular rhythm and normal heart sounds.  Tachycardia present.   No murmur heard. Pulmonary/Chest: Effort normal. She exhibits no tenderness.  Abdominal: Soft. Bowel sounds are normal. There is no tenderness. There is no rebound.  Non tender  Musculoskeletal: She exhibits no tenderness.  Baseline ROM, no obvious new focal weakness.  BACK: No CVA tenderness. Positive para-right lumbar tenderness. No midline or para-left lumbar tenderness.  Neurological:  Mental status and motor strength appears baseline for patient and situation.  Skin: No rash noted.  Psychiatric: She has a normal mood and affect.   ED Course  Procedures (including critical care time) DIAGNOSTIC STUDIES: Oxygen Saturation is 100% on room air, normal by my interpretation.    COORDINATION OF CARE: At 4:35 PM: Discussed treatment plan with patient which includes blood work, kidney function test, and ultrasound of the kidney. Will also receive Dilaudid for pain. Patient agrees.   Patient understand and agree with initial ED impression and plan with expectations set for ED visit. Pt stable in ED with no significant deterioration in condition. Patient informed of clinical course, understand medical decision-making process, and agree with plan.   Labs Review Labs Reviewed  URINALYSIS, ROUTINE W REFLEX MICROSCOPIC - Abnormal; Notable for the following:    Hgb urine dipstick TRACE (*)    Ketones, ur 40 (*)    Protein, ur TRACE (*)    Leukocytes, UA TRACE (*)    All other components within normal limits  CBC WITH DIFFERENTIAL - Abnormal; Notable for the following:    Platelets 446 (*)    All other components within normal limits  COMPREHENSIVE METABOLIC PANEL - Abnormal; Notable for the following:    Glucose, Bld 102 (*)    Total Protein 8.6 (*)    All other components within normal limits  URINE MICROSCOPIC-ADD ON - Abnormal; Notable for the following:    Squamous Epithelial / LPF MANY (*)    All  other components within normal limits  LIPASE, BLOOD   Results for orders placed during the hospital encounter of 12/10/13  URINALYSIS, ROUTINE W REFLEX MICROSCOPIC      Result Value Ref Range   Color, Urine YELLOW  YELLOW   APPearance CLEAR  CLEAR   Specific Gravity, Urine 1.020  1.005 - 1.030   pH 6.5  5.0 - 8.0   Glucose, UA NEGATIVE  NEGATIVE mg/dL   Hgb urine dipstick TRACE (*) NEGATIVE   Bilirubin Urine NEGATIVE  NEGATIVE   Ketones, ur 40 (*) NEGATIVE mg/dL   Protein, ur TRACE (*) NEGATIVE mg/dL   Urobilinogen, UA 0.2  0.0 - 1.0 mg/dL   Nitrite NEGATIVE  NEGATIVE   Leukocytes, UA TRACE (*) NEGATIVE  CBC WITH DIFFERENTIAL      Result Value Ref Range   WBC 6.9  4.0 - 10.5 K/uL  RBC 4.43  3.87 - 5.11 MIL/uL   Hemoglobin 12.8  12.0 - 15.0 g/dL   HCT 16.1  09.6 - 04.5 %   MCV 92.8  78.0 - 100.0 fL   MCH 28.9  26.0 - 34.0 pg   MCHC 31.1  30.0 - 36.0 g/dL   RDW 40.9  81.1 - 91.4 %   Platelets 446 (*) 150 - 400 K/uL   Neutrophils Relative % 73  43 - 77 %   Neutro Abs 5.1  1.7 - 7.7 K/uL   Lymphocytes Relative 18  12 - 46 %   Lymphs Abs 1.2  0.7 - 4.0 K/uL   Monocytes Relative 7  3 - 12 %   Monocytes Absolute 0.5  0.1 - 1.0 K/uL   Eosinophils Relative 1  0 - 5 %   Eosinophils Absolute 0.1  0.0 - 0.7 K/uL   Basophils Relative 1  0 - 1 %   Basophils Absolute 0.0  0.0 - 0.1 K/uL  COMPREHENSIVE METABOLIC PANEL      Result Value Ref Range   Sodium 140  137 - 147 mEq/L   Potassium 4.4  3.7 - 5.3 mEq/L   Chloride 100  96 - 112 mEq/L   CO2 25  19 - 32 mEq/L   Glucose, Bld 102 (*) 70 - 99 mg/dL   BUN 7  6 - 23 mg/dL   Creatinine, Ser 7.82  0.50 - 1.10 mg/dL   Calcium 9.5  8.4 - 95.6 mg/dL   Total Protein 8.6 (*) 6.0 - 8.3 g/dL   Albumin 4.1  3.5 - 5.2 g/dL   AST 20  0 - 37 U/L   ALT 12  0 - 35 U/L   Alkaline Phosphatase 107  39 - 117 U/L   Total Bilirubin 0.4  0.3 - 1.2 mg/dL   GFR calc non Af Amer >90  >90 mL/min   GFR calc Af Amer >90  >90 mL/min  LIPASE, BLOOD       Result Value Ref Range   Lipase 19  11 - 59 U/L  URINE MICROSCOPIC-ADD ON      Result Value Ref Range   Squamous Epithelial / LPF MANY (*) RARE   WBC, UA 0-2  <3 WBC/hpf   RBC / HPF 0-2  <3 RBC/hpf   Bacteria, UA RARE  RARE   Ct Abdomen Pelvis Wo Contrast  12/06/2013   CLINICAL DATA:  Right flank pain, back pain, nausea.  EXAM: CT ABDOMEN AND PELVIS WITHOUT CONTRAST  TECHNIQUE: Multidetector CT imaging of the abdomen and pelvis was performed following the standard protocol without IV contrast.  COMPARISON:  Of 11/19/2012  FINDINGS: Lung bases are clear.  No effusions.  Heart is normal size.  Liver, gallbladder, spleen, pancreas, adrenals and kidneys are unremarkable. No renal or ureteral stones. No hydronephrosis.  Prior appendectomy. Small calcification within the fundus of the uterus likely reflects partially calcified fibroid. Uterus, and adnexa and urinary bladder otherwise unremarkable. No free fluid, free air or adenopathy. Stomach, large and small bowel are unremarkable.  No acute bony abnormality or focal bone lesion.  IMPRESSION: No renal or ureteral stones.  No hydronephrosis.  No acute findings.   Electronically Signed   By: Charlett Nose M.D.   On: 12/06/2013 10:52   US Renal  12/10/2013   CLINICAL DATA:  Back pain and right flank pain.  EXAM: RENAL/URINARY TRACT ULTRASOUND COMPLETE  COMPARISON:  Abdominal ultrasound 09/05/2012. CT of the abdomen and pelvis 12/06/2013.  FINDINGS: Right Kidney:  Length: 12.7 cm. Echogenicity within normal limits. No mass or hydronephrosis visualized.  Left Kidney:  Length: 12.3 cm. Echogenicity within normal limits. No mass or hydronephrosis visualized.  Bladder:  Appears normal for degree of bladder distention.  IMPRESSION: 1. Normal urinary tract ultrasound.   Electronically Signed   By: Trudie Reedaniel  Entrikin M.D.   On: 12/10/2013 18:53     EKG Interpretation None      MDM  I doubt any other [facility] precluding discharge at this time including, but not  necessarily limited to the following:renal stone, pyelonephritis, cauda equina syndrome.  Final diagnoses:  Right flank pain    I personally performed the services described in this documentation, which was scribed in my presence. The recorded information has been reviewed and is accurate.     Hurman HornJohn M Sheli Dorin, MD 12/13/13 [phone]

## 2014-03-27 ENCOUNTER — Encounter (HOSPITAL_COMMUNITY): Payer: Self-pay | Admitting: Emergency Medicine

## 2014-03-27 ENCOUNTER — Emergency Department (HOSPITAL_COMMUNITY)
Admission: EM | Admit: 2014-03-27 | Discharge: 2014-03-27 | Disposition: A | Discharge status: Home / Self Care | Payer: Self-pay | Attending: Emergency Medicine | Admitting: Emergency Medicine

## 2014-03-27 DIAGNOSIS — G8929 Other chronic pain: Secondary | ICD-10-CM | POA: Insufficient documentation

## 2014-03-27 DIAGNOSIS — F329 Major depressive disorder, single episode, unspecified: Secondary | ICD-10-CM | POA: Insufficient documentation

## 2014-03-27 DIAGNOSIS — Z7982 Long term (current) use of aspirin: Secondary | ICD-10-CM | POA: Insufficient documentation

## 2014-03-27 DIAGNOSIS — F17200 Nicotine dependence, unspecified, uncomplicated: Secondary | ICD-10-CM | POA: Insufficient documentation

## 2014-03-27 DIAGNOSIS — Z792 Long term (current) use of antibiotics: Secondary | ICD-10-CM | POA: Insufficient documentation

## 2014-03-27 DIAGNOSIS — IMO0002 Reserved for concepts with insufficient information to code with codable children: Secondary | ICD-10-CM | POA: Insufficient documentation

## 2014-03-27 DIAGNOSIS — R110 Nausea: Secondary | ICD-10-CM | POA: Insufficient documentation

## 2014-03-27 DIAGNOSIS — R21 Rash and other nonspecific skin eruption: Secondary | ICD-10-CM | POA: Insufficient documentation

## 2014-03-27 DIAGNOSIS — I10 Essential (primary) hypertension: Secondary | ICD-10-CM | POA: Insufficient documentation

## 2014-03-27 DIAGNOSIS — F3289 Other specified depressive episodes: Secondary | ICD-10-CM | POA: Insufficient documentation

## 2014-03-27 DIAGNOSIS — F411 Generalized anxiety disorder: Secondary | ICD-10-CM | POA: Insufficient documentation

## 2014-03-27 MED ORDER — DEXAMETHASONE SODIUM PHOSPHATE 10 MG/ML IJ SOLN
10.0000 mg | Freq: Once | INTRAMUSCULAR | Status: AC
Start: 2014-03-27 — End: 2014-03-27
  Administered 2014-03-27: 10 mg via INTRAMUSCULAR
  Filled 2014-03-27: qty 1

## 2014-03-27 MED ORDER — PREDNISONE 20 MG PO TABS: ORAL_TABLET | ORAL | Status: DC

## 2014-03-27 MED ORDER — DIPHENHYDRAMINE HCL 25 MG PO TABS: 25.0000 mg | ORAL_TABLET | Freq: Four times a day (QID) | ORAL | Status: DC

## 2014-03-27 NOTE — ED Provider Notes (Signed)
CSN: [id number]     Arrival date & time 03/27/14  2011 History   First MD Initiated Contact with Patient 03/27/14 2302     Chief Complaint  Patient presents with  . Rash     (Consider location/radiation/quality/duration/timing/severity/associated sxs/prior Treatment) HPI Comments: Pt is a 43 y/o female with a PMHx of HTN, anxiety, depression and chronic back pain who presents to the ED complaining of a rash on her right knee x 2 days. Pt reports she went to bed with long pants on after being outside, and when she woke up she had an itchy rash on her right knee. States it appeared to be a mosquito bite, however worsened and spread across her knee. She tried taking left over prednisone ( ) that she had at home with some improvement of the rash. States she is feeling nauseated. Denies knee swelling. Denies fevers. She cannot recall being bit by anything, no known tick exposures. No contacts with similar rash.  Patient is a 43 y.o. female presenting with rash. The history is provided by the patient.  Rash Associated symptoms: nausea     Past Medical History  Diagnosis Date  . Anxiety   . Depression   . Hypertension   . Back pain, chronic    Past Surgical History  Procedure Laterality Date  . Appendectomy    . Tubal ligation     Family History  Problem Relation Age of Onset  . Arthritis    . Lung disease     History  Substance Use Topics  . Smoking status: Current Every Day Smoker  . Smokeless tobacco: Not on file  . Alcohol Use: No   OB History   Grav Para Term Preterm Abortions TAB SAB Ect Mult Living                 Review of Systems  Gastrointestinal: Positive for nausea.  Skin: Positive for rash.  All other systems reviewed and are negative.     Allergies  Biaxin  Home Medications   Prior to Admission medications   Medication Sig Start Date End Date Taking? Authorizing Provider  Aspirin-Acetaminophen-Caffeine (GOODYS EXTRA STRENGTH) 520-260-32.5 MG PACK  Take 1 packet by mouth 2 (two) times daily as needed (pain).    Historical Provider, MD  cephALEXin (KEFLEX) 500 MG capsule Take 1 capsule (500 mg total) by mouth 4 (four) times daily. 12/06/13   Burgess Amor, PA-C  diphenhydrAMINE (BENADRYL) 25 MG tablet Take 1 tablet (25 mg total) by mouth every 6 (six) hours. 03/27/14   Trevor Mace, PA-C  escitalopram (LEXAPRO) 20 MG tablet Take 20 mg by mouth daily.    Historical Provider, MD  esomeprazole (NEXIUM) 40 MG capsule Take 40 mg by mouth daily before breakfast.    Historical Provider, MD  ibuprofen (ADVIL,MOTRIN) 200 MG tablet Take 400-800 mg by mouth every 8 (eight) hours as needed for moderate pain.     Historical Provider, MD  ibuprofen (ADVIL,MOTRIN) 800 MG tablet Take 1 tablet (800 mg total) by mouth every 8 (eight) hours as needed for moderate pain. 12/06/13   Burgess Amor, PA-C  lisinopril (PRINIVIL,ZESTRIL) 20 MG tablet Take 20 mg by mouth daily.    Historical Provider, MD  loratadine (CLARITIN) 10 MG tablet Take 10 mg by mouth daily.    Historical Provider, MD  Multiple Vitamins-Minerals (MULTIVITAMINS THER. W/MINERALS) TABS tablet Take 1 tablet by mouth daily.    Historical Provider, MD  oxyCODONE-acetaminophen (PERCOCET) 5-325 MG per tablet Take 2 tablets  by mouth every 4 (four) hours as needed. 12/10/13   Hurman Horn, MD  oxyCODONE-acetaminophen (PERCOCET/ROXICET) 5-325 MG per tablet Take 1 tablet by mouth every 4 (four) hours as needed for severe pain. 12/06/13   Burgess Amor, PA-C  predniSONE (DELTASONE) 20 MG tablet Take 3 tabs PO x 2 days followed by 2 tabs PO x 2 days followed by 1 tab PO x 2 days 03/27/14   Trevor Mace, PA-C   BP 131/86  Pulse 94  Temp(Src) 98.8 F (37.1 C) (Oral)  Resp 18  Ht  (1.549 m)  Wt 235 lb (106.595 kg)  BMI 44.43 kg/m2  SpO2 97% Physical Exam  Nursing note and vitals reviewed. Constitutional: She is oriented to person, place, and time. She appears well-developed and well-nourished. No distress.   HENT:  Head: Normocephalic and atraumatic.  Mouth/Throat: Oropharynx is clear and moist.  Eyes: Conjunctivae are normal.  Neck: Normal range of motion. Neck supple.  Cardiovascular: Normal rate, regular rhythm, normal heart sounds and intact distal pulses.   Pulmonary/Chest: Effort normal and breath sounds normal.  Abdominal: Soft. Bowel sounds are normal. There is no tenderness.  Musculoskeletal: Normal range of motion. She exhibits no edema.  Right knee FROM, no erythema, edema or warmth.  Neurological: She is alert and oriented to person, place, and time.  Skin: Skin is warm and dry. She is not diaphoretic.  Scattered erythematous small raised wheels with excoriations over right knee, up to mid-thigh and down to mid-lower leg anterior only. Spares palms of hands, soles of feet. No signs of secondary infection.  Psychiatric: She has a normal mood and affect. Her behavior is normal.    ED Course  Procedures (including critical care time) Labs Review Labs Reviewed - No data to display  Imaging Review No results found.   EKG Interpretation None      MDM   Final diagnoses:  Rash   Pt presenting with rash over right knee after possible mosquito bite. She is non-toxic appearing and in NAD. AFVSS. Rash appearance of excoriated wheels from bug bite and scratching. Improvement after she took a dose of leftover prednisone at home. Knee itself not swollen, erythematous or warm. FROM. Treat with prednisone, benadryl and hydrocortisone cream. F/u with PCP. Stable for d/c. Return precautions given. Patient states understanding of treatment care plan and is agreeable.  Trevor Mace, PA-C 03/28/14 [phone]

## 2014-03-27 NOTE — Discharge Instructions (Signed)
Take prednisone as prescribed beginning tomorrow as you were given the first dose in the emergency department today. Take benadryl as directed as needed for itching. Try not to scratch the rash.  Rash A rash is a change in the color or texture of your skin. There are many different types of rashes. You may have other problems that accompany your rash. CAUSES   Infections.  Allergic reactions. This can include allergies to pets or foods.  Certain medicines.  Exposure to certain chemicals, soaps, or cosmetics.  Heat.  Exposure to poisonous plants.  Tumors, both cancerous and noncancerous. SYMPTOMS   Redness.  Scaly skin.  Itchy skin.  Dry or cracked skin.  Bumps.  Blisters.  Pain. DIAGNOSIS  Your caregiver may do a physical exam to determine what type of rash you have. A skin sample (biopsy) may be taken and examined under a microscope. TREATMENT  Treatment depends on the type of rash you have. Your caregiver may prescribe certain medicines. For serious conditions, you may need to see a skin doctor (dermatologist). HOME CARE INSTRUCTIONS   Avoid the substance that caused your rash.  Do not scratch your rash. This can cause infection.  You may take cool baths to help stop itching.  Only take over-the-counter or prescription medicines as directed by your caregiver.  Keep all follow-up appointments as directed by your caregiver. SEEK IMMEDIATE MEDICAL CARE IF:  You have increasing pain, swelling, or redness.  You have a fever.  You have new or severe symptoms.  You have body aches, diarrhea, or vomiting.  Your rash is not better after 3 days. MAKE SURE YOU:  Understand these instructions.  Will watch your condition.  Will get help right away if you are not doing well or get worse. Document Released: 06/18/2002 Document Revised: 09/20/2011 Document Reviewed: 04/12/2011 [facility] Patient Information 2015 [place], [place]. This information is not intended to  replace advice given to you by your health care provider. Make sure you discuss any questions you have with your health care provider.

## 2014-03-27 NOTE — ED Notes (Signed)
Pt c/o rt knee rash since yesterday.

## 2014-03-30 NOTE — ED Provider Notes (Signed)
Medical screening examination/treatment/procedure(s) were performed by non-physician practitioner and as supervising physician I was immediately available for consultation/collaboration.   EKG Interpretation None        Samuel Jester, DO 03/30/14 1726

## 2015-10-21 ENCOUNTER — Encounter: Payer: Self-pay | Admitting: Physical Medicine & Rehabilitation

## 2016-01-17 ENCOUNTER — Emergency Department (HOSPITAL_COMMUNITY): Payer: Self-pay

## 2016-01-17 ENCOUNTER — Observation Stay (HOSPITAL_COMMUNITY)
Admission: EM | Admit: 2016-01-17 | Discharge: 2016-01-17 | Disposition: A | Discharge status: Home / Self Care | Payer: Self-pay | Attending: Internal Medicine | Admitting: Internal Medicine

## 2016-01-17 ENCOUNTER — Encounter (HOSPITAL_COMMUNITY): Payer: Self-pay | Admitting: Emergency Medicine

## 2016-01-17 DIAGNOSIS — D649 Anemia, unspecified: Secondary | ICD-10-CM | POA: Diagnosis present

## 2016-01-17 DIAGNOSIS — F17200 Nicotine dependence, unspecified, uncomplicated: Secondary | ICD-10-CM | POA: Insufficient documentation

## 2016-01-17 DIAGNOSIS — S81011A Laceration without foreign body, right knee, initial encounter: Secondary | ICD-10-CM

## 2016-01-17 DIAGNOSIS — F329 Major depressive disorder, single episode, unspecified: Secondary | ICD-10-CM | POA: Insufficient documentation

## 2016-01-17 DIAGNOSIS — S61411A Laceration without foreign body of right hand, initial encounter: Secondary | ICD-10-CM | POA: Insufficient documentation

## 2016-01-17 DIAGNOSIS — I10 Essential (primary) hypertension: Secondary | ICD-10-CM | POA: Insufficient documentation

## 2016-01-17 DIAGNOSIS — F1010 Alcohol abuse, uncomplicated: Secondary | ICD-10-CM

## 2016-01-17 DIAGNOSIS — K219 Gastro-esophageal reflux disease without esophagitis: Secondary | ICD-10-CM | POA: Insufficient documentation

## 2016-01-17 DIAGNOSIS — K2970 Gastritis, unspecified, without bleeding: Secondary | ICD-10-CM

## 2016-01-17 DIAGNOSIS — E039 Hypothyroidism, unspecified: Secondary | ICD-10-CM | POA: Insufficient documentation

## 2016-01-17 DIAGNOSIS — M25571 Pain in right ankle and joints of right foot: Secondary | ICD-10-CM

## 2016-01-17 DIAGNOSIS — R7989 Other specified abnormal findings of blood chemistry: Secondary | ICD-10-CM

## 2016-01-17 DIAGNOSIS — Z3202 Encounter for pregnancy test, result negative: Secondary | ICD-10-CM | POA: Insufficient documentation

## 2016-01-17 DIAGNOSIS — M79661 Pain in right lower leg: Secondary | ICD-10-CM

## 2016-01-17 DIAGNOSIS — F419 Anxiety disorder, unspecified: Secondary | ICD-10-CM | POA: Insufficient documentation

## 2016-01-17 DIAGNOSIS — D509 Iron deficiency anemia, unspecified: Principal | ICD-10-CM | POA: Insufficient documentation

## 2016-01-17 DIAGNOSIS — R000 Tachycardia, unspecified: Secondary | ICD-10-CM

## 2016-01-17 DIAGNOSIS — M549 Dorsalgia, unspecified: Secondary | ICD-10-CM | POA: Insufficient documentation

## 2016-01-17 DIAGNOSIS — S81811A Laceration without foreign body, right lower leg, initial encounter: Secondary | ICD-10-CM | POA: Insufficient documentation

## 2016-01-17 DIAGNOSIS — G8929 Other chronic pain: Secondary | ICD-10-CM | POA: Insufficient documentation

## 2016-01-17 DIAGNOSIS — S61511A Laceration without foreign body of right wrist, initial encounter: Secondary | ICD-10-CM | POA: Insufficient documentation

## 2016-01-17 LAB — BASIC METABOLIC PANEL
ANION GAP: 5 (ref 5–15)
BUN: 11 mg/dL (ref 6–20)
CO2: 26 mmol/L (ref 22–32)
Calcium: 8.4 mg/dL — ABNORMAL LOW (ref 8.9–10.3)
Chloride: 107 mmol/L (ref 101–111)
Creatinine, Ser: 0.57 mg/dL (ref 0.44–1.00)
GFR calc Af Amer: >60 mL/min (ref >60)
GFR calc non Af Amer: >60 mL/min (ref >60)
GLUCOSE: 112 mg/dL — AB (ref 65–99)
POTASSIUM: 3.8 mmol/L (ref 3.5–5.1)
Sodium: 138 mmol/L (ref 135–145)

## 2016-01-17 LAB — CBC
HCT: 29.3 % — ABNORMAL LOW (ref 36.0–46.0)
HEMATOCRIT: 23.8 % — AB (ref 36.0–46.0)
Hemoglobin: 6.6 g/dL — CL (ref 12.0–15.0)
Hemoglobin: 8.6 g/dL — ABNORMAL LOW (ref 12.0–15.0)
MCH: 22.2 pg — AB (ref 26.0–34.0)
MCH: 24.1 pg — ABNORMAL LOW (ref 26.0–34.0)
MCHC: 27.7 g/dL — ABNORMAL LOW (ref 30.0–36.0)
MCHC: 29.4 g/dL — ABNORMAL LOW (ref 30.0–36.0)
MCV: 80.1 fL (ref 78.0–100.0)
MCV: 82.1 fL (ref 78.0–100.0)
PLATELETS: 330 10*3/uL (ref 150–400)
Platelets: 326 10*3/uL (ref 150–400)
RBC: 2.97 MIL/uL — AB (ref 3.87–5.11)
RBC: 3.57 MIL/uL — ABNORMAL LOW (ref 3.87–5.11)
RDW: 18.9 % — ABNORMAL HIGH (ref 11.5–15.5)
RDW: 17.2 % — AB (ref 11.5–15.5)
WBC: 7.4 10*3/uL (ref 4.0–10.5)
WBC: 6.8 10*3/uL (ref 4.0–10.5)

## 2016-01-17 LAB — RETICULOCYTES
RBC.: 3.17 MIL/uL — AB (ref 3.87–5.11)
RETIC COUNT ABSOLUTE: 38 10*3/uL (ref 19.0–186.0)
Retic Ct Pct: 1.2 % (ref 0.4–3.1)

## 2016-01-17 LAB — IRON AND TIBC
Iron: 12 ug/dL — ABNORMAL LOW (ref 28–170)
Saturation Ratios: 2 % — ABNORMAL LOW (ref 10.4–31.8)
TIBC: 521 ug/dL — ABNORMAL HIGH (ref 250–450)
UIBC: 509 ug/dL

## 2016-01-17 LAB — FERRITIN: Ferritin: 6 ng/mL — ABNORMAL LOW (ref 11–307)

## 2016-01-17 LAB — D-DIMER, QUANTITATIVE (NOT AT ARMC): D DIMER QUANT: 0.5 ug{FEU}/mL (ref 0.00–0.50)

## 2016-01-17 LAB — TSH: TSH: 2.506 u[IU]/mL (ref 0.350–4.500)

## 2016-01-17 LAB — HEPATIC FUNCTION PANEL
ALT: 12 U/L — ABNORMAL LOW (ref 14–54)
AST: 18 U/L (ref 15–41)
Albumin: 3.2 g/dL — ABNORMAL LOW (ref 3.5–5.0)
Alkaline Phosphatase: 75 U/L (ref 38–126)
BILIRUBIN DIRECT: 0.2 mg/dL (ref 0.1–0.5)
BILIRUBIN TOTAL: 1 mg/dL (ref 0.3–1.2)
Indirect Bilirubin: 0.8 mg/dL (ref 0.3–0.9)
Total Protein: 6.6 g/dL (ref 6.5–8.1)

## 2016-01-17 LAB — ABO/RH: ABO/RH(D): O NEG

## 2016-01-17 LAB — FOLATE: FOLATE: 5.4 ng/mL — AB (ref >5.9)

## 2016-01-17 LAB — POC URINE PREG, ED: PREG TEST UR: NEGATIVE

## 2016-01-17 LAB — VITAMIN B12: VITAMIN B 12: 255 pg/mL (ref 180–914)

## 2016-01-17 LAB — PREPARE RBC (CROSSMATCH)

## 2016-01-17 MED ORDER — SODIUM CHLORIDE 0.9 % IV SOLN
Freq: Once | INTRAVENOUS | Status: AC
  Administered 2016-01-17: 09:00:00 via INTRAVENOUS

## 2016-01-17 MED ORDER — FOLIC ACID 1 MG PO TABS
1.0000 mg | ORAL_TABLET | Freq: Every day | ORAL | Status: DC
  Administered 2016-01-17: 1 mg via ORAL
  Filled 2016-01-17: qty 1

## 2016-01-17 MED ORDER — ACETAMINOPHEN-CODEINE #3 300-30 MG PO TABS: 1.0000 | ORAL_TABLET | ORAL | Status: DC | PRN

## 2016-01-17 MED ORDER — ADULT MULTIVITAMIN W/MINERALS CH
1.0000 | ORAL_TABLET | Freq: Every day | ORAL | Status: DC
  Administered 2016-01-17: 1 via ORAL
  Filled 2016-01-17: qty 1

## 2016-01-17 MED ORDER — ACETAMINOPHEN-CODEINE #3 300-30 MG PO TABS
1.0000 | ORAL_TABLET | Freq: Three times a day (TID) | ORAL | Status: DC | PRN
  Administered 2016-01-17: 1 via ORAL
  Filled 2016-01-17: qty 1

## 2016-01-17 MED ORDER — ACETAMINOPHEN 650 MG RE SUPP: 650.0000 mg | Freq: Four times a day (QID) | RECTAL | Status: DC | PRN

## 2016-01-17 MED ORDER — VITAMIN B-1 100 MG PO TABS
100.0000 mg | ORAL_TABLET | Freq: Every day | ORAL | Status: DC
  Administered 2016-01-17: 100 mg via ORAL
  Filled 2016-01-17: qty 1

## 2016-01-17 MED ORDER — MORPHINE SULFATE (PF) 4 MG/ML IV SOLN
4.0000 mg | Freq: Once | INTRAVENOUS | Status: AC
  Administered 2016-01-17: 4 mg via INTRAVENOUS
  Filled 2016-01-17: qty 1

## 2016-01-17 MED ORDER — THIAMINE HCL 100 MG/ML IJ SOLN: 100.0000 mg | Freq: Every day | INTRAMUSCULAR | Status: DC

## 2016-01-17 MED ORDER — PANTOPRAZOLE SODIUM 40 MG PO TBEC: 40.0000 mg | DELAYED_RELEASE_TABLET | Freq: Two times a day (BID) | ORAL | Status: DC

## 2016-01-17 MED ORDER — ACETAMINOPHEN 325 MG PO TABS
650.0000 mg | ORAL_TABLET | Freq: Four times a day (QID) | ORAL | Status: DC | PRN
  Administered 2016-01-17: 650 mg via ORAL
  Filled 2016-01-17: qty 2

## 2016-01-17 MED ORDER — ADULT MULTIVITAMIN W/MINERALS CH
1.0000 | ORAL_TABLET | Freq: Every day | ORAL | Status: DC
Start: 2016-01-17 — End: 2016-11-29

## 2016-01-17 MED ORDER — PANTOPRAZOLE SODIUM 40 MG PO TBEC: 40.0000 mg | DELAYED_RELEASE_TABLET | Freq: Every day | ORAL | Status: DC

## 2016-01-17 MED ORDER — ACETAMINOPHEN-CODEINE #3 300-30 MG PO TABS
1.0000 | ORAL_TABLET | Freq: Four times a day (QID) | ORAL | Status: DC | PRN
  Administered 2016-01-17: 1 via ORAL
  Filled 2016-01-17: qty 1

## 2016-01-17 MED ORDER — CEPHALEXIN 500 MG PO CAPS: 500.0000 mg | ORAL_CAPSULE | Freq: Four times a day (QID) | ORAL | Status: DC

## 2016-01-17 MED ORDER — FERROUS SULFATE 325 (65 FE) MG PO TABS
325.0000 mg | ORAL_TABLET | Freq: Every day | ORAL | Status: DC
Start: 2016-01-17 — End: 2016-11-29

## 2016-01-17 MED ORDER — PANTOPRAZOLE SODIUM 40 MG PO TBEC
40.0000 mg | DELAYED_RELEASE_TABLET | Freq: Two times a day (BID) | ORAL | Status: DC
  Administered 2016-01-17: 40 mg via ORAL
  Filled 2016-01-17: qty 1

## 2016-01-17 MED ORDER — THIAMINE HCL 100 MG PO TABS: 100.0000 mg | ORAL_TABLET | Freq: Every day | ORAL | Status: DC

## 2016-01-17 MED ORDER — LIDOCAINE-EPINEPHRINE (PF) 2 %-1:200000 IJ SOLN
20.0000 mL | Freq: Once | INTRAMUSCULAR | Status: AC
  Administered 2016-01-17: 20 mL
  Filled 2016-01-17: qty 20

## 2016-01-17 MED ORDER — MORPHINE SULFATE (PF) 4 MG/ML IV SOLN
6.0000 mg | Freq: Once | INTRAVENOUS | Status: AC
Start: 2016-01-17 — End: 2016-01-17
  Administered 2016-01-17: 6 mg via INTRAVENOUS
  Filled 2016-01-17: qty 2

## 2016-01-17 MED ORDER — FERUMOXYTOL INJECTION 510 MG/17 ML
510.0000 mg | Freq: Once | INTRAVENOUS | Status: AC
  Administered 2016-01-17: 510 mg via INTRAVENOUS
  Filled 2016-01-17 (×2): qty 17

## 2016-01-17 MED ORDER — CEPHALEXIN 250 MG PO CAPS
500.0000 mg | ORAL_CAPSULE | Freq: Once | ORAL | Status: AC
  Administered 2016-01-17: 500 mg via ORAL
  Filled 2016-01-17: qty 2

## 2016-01-17 MED ORDER — FOLIC ACID 1 MG PO TABS: 1.0000 mg | ORAL_TABLET | Freq: Every day | ORAL | Status: DC
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
## 2016-01-17 NOTE — ED Notes (Signed)
Patient transported to X-ray 
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
## 2016-01-17 NOTE — ED Notes (Signed)
Foot of bed elevated for pt.

## 2016-01-17 NOTE — ED Notes (Signed)
MD at bedside suturing wounds

## 2016-01-17 NOTE — ED Notes (Signed)
Dr. Patria Maneampos made aware of pts hemoglobin.
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
## 2016-01-17 NOTE — ED Notes (Signed)
Attempted report 

## 2016-01-17 NOTE — ED Notes (Signed)
Admitting at bedside 

## 2016-01-18 LAB — TYPE AND SCREEN
ABO/RH(D): O NEG
ANTIBODY SCREEN: NEGATIVE
UNIT DIVISION: 0
Unit division: 0

## 2016-01-18 LAB — HIV ANTIBODY (ROUTINE TESTING W REFLEX): HIV Screen 4th Generation wRfx: NONREACTIVE

## 2016-11-29 ENCOUNTER — Encounter (HOSPITAL_COMMUNITY): Payer: Self-pay | Admitting: Emergency Medicine

## 2016-11-29 ENCOUNTER — Emergency Department (HOSPITAL_COMMUNITY)
Admission: EM | Admit: 2016-11-29 | Discharge: 2016-11-29 | Disposition: A | Discharge status: Home Health | Payer: Self-pay | Attending: Emergency Medicine | Admitting: Emergency Medicine

## 2016-11-29 DIAGNOSIS — R51 Headache: Secondary | ICD-10-CM

## 2016-11-29 DIAGNOSIS — I10 Essential (primary) hypertension: Secondary | ICD-10-CM | POA: Insufficient documentation

## 2016-11-29 DIAGNOSIS — J029 Acute pharyngitis, unspecified: Secondary | ICD-10-CM

## 2016-11-29 DIAGNOSIS — F17200 Nicotine dependence, unspecified, uncomplicated: Secondary | ICD-10-CM | POA: Insufficient documentation

## 2016-11-29 DIAGNOSIS — Z79899 Other long term (current) drug therapy: Secondary | ICD-10-CM | POA: Insufficient documentation

## 2016-11-29 DIAGNOSIS — R519 Headache, unspecified: Secondary | ICD-10-CM

## 2016-11-29 MED ORDER — IBUPROFEN 600 MG PO TABS: 600.0000 mg | ORAL_TABLET | Freq: Four times a day (QID) | ORAL | 0 refills | Status: DC | PRN

## 2016-11-29 MED ORDER — KETOROLAC TROMETHAMINE 10 MG PO TABS
10.0000 mg | ORAL_TABLET | Freq: Once | ORAL | Status: AC
  Administered 2016-11-29: 10 mg via ORAL
  Filled 2016-11-29: qty 1

## 2016-11-29 MED ORDER — AMOXICILLIN 500 MG PO CAPS: 500.0000 mg | ORAL_CAPSULE | Freq: Three times a day (TID) | ORAL | 0 refills | Status: DC

## 2016-11-29 MED ORDER — DIPHENHYDRAMINE HCL 25 MG PO CAPS
25.0000 mg | ORAL_CAPSULE | Freq: Once | ORAL | Status: AC
  Administered 2016-11-29: 25 mg via ORAL
  Filled 2016-11-29: qty 1

## 2016-11-29 MED ORDER — MAGIC MOUTHWASH W/LIDOCAINE: 5.0000 mL | Freq: Three times a day (TID) | ORAL | 0 refills | Status: DC | PRN
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
## 2016-11-29 NOTE — ED Triage Notes (Signed)
Patient complaining of sore throat, sinus drainage, and headache x 1 week, worsening the last 2 days.

## 2016-11-29 NOTE — Discharge Instructions (Signed)
Drink plenty of fluids.  Follow-up with your doctor for recheck.  Return here for any worsening symptoms

## 2017-10-18 ENCOUNTER — Encounter (HOSPITAL_COMMUNITY): Payer: Self-pay | Admitting: *Deleted

## 2017-10-18 ENCOUNTER — Other Ambulatory Visit: Payer: Self-pay

## 2017-10-18 ENCOUNTER — Emergency Department (HOSPITAL_COMMUNITY)
Admission: EM | Admit: 2017-10-18 | Discharge: 2017-10-18 | Disposition: A | Discharge status: Home / Self Care | Payer: Self-pay | Attending: Emergency Medicine | Admitting: Emergency Medicine

## 2017-10-18 DIAGNOSIS — Z79899 Other long term (current) drug therapy: Secondary | ICD-10-CM | POA: Insufficient documentation

## 2017-10-18 DIAGNOSIS — F17210 Nicotine dependence, cigarettes, uncomplicated: Secondary | ICD-10-CM | POA: Insufficient documentation

## 2017-10-18 DIAGNOSIS — R030 Elevated blood-pressure reading, without diagnosis of hypertension: Secondary | ICD-10-CM

## 2017-10-18 DIAGNOSIS — I10 Essential (primary) hypertension: Secondary | ICD-10-CM | POA: Insufficient documentation

## 2017-10-18 DIAGNOSIS — J0101 Acute recurrent maxillary sinusitis: Secondary | ICD-10-CM | POA: Insufficient documentation

## 2017-10-18 MED ORDER — LORATADINE-PSEUDOEPHEDRINE ER 5-120 MG PO TB12: 1.0000 | ORAL_TABLET | Freq: Two times a day (BID) | ORAL | 0 refills | Status: DC

## 2017-10-18 MED ORDER — MAGIC MOUTHWASH W/LIDOCAINE: 10.0000 mL | Freq: Four times a day (QID) | ORAL | 0 refills | Status: DC | PRN

## 2017-10-18 MED ORDER — LEVOFLOXACIN 500 MG PO TABS: 500.0000 mg | ORAL_TABLET | Freq: Every day | ORAL | 0 refills | Status: DC

## 2017-10-18 MED ORDER — GUAIFENESIN ER 600 MG PO TB12: 1200.0000 mg | ORAL_TABLET | Freq: Two times a day (BID) | ORAL | 0 refills | Status: DC

## 2017-10-18 MED ORDER — NAPROXEN 500 MG PO TABS: 500.0000 mg | ORAL_TABLET | Freq: Two times a day (BID) | ORAL | 0 refills | Status: DC

## 2017-10-18 NOTE — Discharge Instructions (Addendum)
Use the medicines prescribed.  As discussed, make sure you are drinking plenty of fluids while taking mucinex.  This should help loosen and soften your sinus drainage.
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
## 2017-10-18 NOTE — ED Triage Notes (Signed)
Pt c/o sore throat, nasal congestion, low grade fever since Saturday. Pt has used Benadryl and Tylenol with some relief.

## 2017-11-02 ENCOUNTER — Emergency Department (HOSPITAL_COMMUNITY)
Admission: EM | Admit: 2017-11-02 | Discharge: 2017-11-02 | Disposition: A | Discharge status: Home / Self Care | Payer: Self-pay | Attending: Emergency Medicine | Admitting: Emergency Medicine

## 2017-11-02 ENCOUNTER — Other Ambulatory Visit: Payer: Self-pay

## 2017-11-02 ENCOUNTER — Emergency Department (HOSPITAL_COMMUNITY): Payer: Self-pay

## 2017-11-02 ENCOUNTER — Encounter (HOSPITAL_COMMUNITY): Payer: Self-pay | Admitting: Emergency Medicine

## 2017-11-02 DIAGNOSIS — I10 Essential (primary) hypertension: Secondary | ICD-10-CM | POA: Insufficient documentation

## 2017-11-02 DIAGNOSIS — F419 Anxiety disorder, unspecified: Secondary | ICD-10-CM | POA: Insufficient documentation

## 2017-11-02 DIAGNOSIS — R079 Chest pain, unspecified: Secondary | ICD-10-CM

## 2017-11-02 DIAGNOSIS — R0602 Shortness of breath: Secondary | ICD-10-CM

## 2017-11-02 DIAGNOSIS — R05 Cough: Secondary | ICD-10-CM | POA: Insufficient documentation

## 2017-11-02 DIAGNOSIS — F329 Major depressive disorder, single episode, unspecified: Secondary | ICD-10-CM | POA: Insufficient documentation

## 2017-11-02 DIAGNOSIS — F17210 Nicotine dependence, cigarettes, uncomplicated: Secondary | ICD-10-CM | POA: Insufficient documentation

## 2017-11-02 DIAGNOSIS — R059 Cough, unspecified: Secondary | ICD-10-CM

## 2017-11-02 DIAGNOSIS — R072 Precordial pain: Secondary | ICD-10-CM

## 2017-11-02 LAB — COMPREHENSIVE METABOLIC PANEL
ALK PHOS: 87 U/L (ref 38–126)
ALT: 10 U/L — ABNORMAL LOW (ref 14–54)
AST: 17 U/L (ref 15–41)
Albumin: 4 g/dL (ref 3.5–5.0)
Anion gap: 12 (ref 5–15)
BUN: 15 mg/dL (ref 6–20)
CALCIUM: 9.3 mg/dL (ref 8.9–10.3)
CHLORIDE: 101 mmol/L (ref 101–111)
CO2: 24 mmol/L (ref 22–32)
CREATININE: 0.55 mg/dL (ref 0.44–1.00)
GFR calc Af Amer: >60 mL/min (ref >60)
GFR calc non Af Amer: >60 mL/min (ref >60)
GLUCOSE: 87 mg/dL (ref 65–99)
Potassium: 3.7 mmol/L (ref 3.5–5.1)
SODIUM: 137 mmol/L (ref 135–145)
Total Bilirubin: 0.6 mg/dL (ref 0.3–1.2)
Total Protein: 8.2 g/dL — ABNORMAL HIGH (ref 6.5–8.1)

## 2017-11-02 LAB — CBC WITH DIFFERENTIAL/PLATELET
Basophils Absolute: 0 10*3/uL (ref 0.0–0.1)
Basophils Relative: 1 %
Eosinophils Absolute: 0 10*3/uL (ref 0.0–0.7)
Eosinophils Relative: 1 %
HCT: 29.6 % — ABNORMAL LOW (ref 36.0–46.0)
Hemoglobin: 7.8 g/dL — ABNORMAL LOW (ref 12.0–15.0)
Lymphocytes Relative: 18 %
Lymphs Abs: 1 10*3/uL (ref 0.7–4.0)
MCH: 20.3 pg — ABNORMAL LOW (ref 26.0–34.0)
MCHC: 26.4 g/dL — ABNORMAL LOW (ref 30.0–36.0)
MCV: 77.1 fL — ABNORMAL LOW (ref 78.0–100.0)
Monocytes Absolute: 0.6 10*3/uL (ref 0.1–1.0)
Monocytes Relative: 11 %
Neutro Abs: 3.9 10*3/uL (ref 1.7–7.7)
Neutrophils Relative %: 69 %
Platelets: 310 10*3/uL (ref 150–400)
RBC: 3.84 MIL/uL — ABNORMAL LOW (ref 3.87–5.11)
RDW: 17.5 % — ABNORMAL HIGH (ref 11.5–15.5)
WBC: 5.5 10*3/uL (ref 4.0–10.5)

## 2017-11-02 LAB — I-STAT TROPONIN, ED: Troponin i, poc: 0 ng/mL (ref 0.00–0.08)

## 2017-11-02 MED ORDER — SODIUM CHLORIDE 0.9 % IV BOLUS
500.0000 mL | Freq: Once | INTRAVENOUS | Status: AC
  Administered 2017-11-02: 500 mL via INTRAVENOUS

## 2017-11-02 MED ORDER — ALBUTEROL SULFATE HFA 108 (90 BASE) MCG/ACT IN AERS: 1.0000 | INHALATION_SPRAY | Freq: Four times a day (QID) | RESPIRATORY_TRACT | 0 refills | Status: DC | PRN

## 2017-11-02 MED ORDER — KETOROLAC TROMETHAMINE 30 MG/ML IJ SOLN
30.0000 mg | Freq: Once | INTRAMUSCULAR | Status: AC
  Administered 2017-11-02: 30 mg via INTRAVENOUS
  Filled 2017-11-02: qty 1

## 2017-11-02 MED ORDER — DIPHENHYDRAMINE HCL 50 MG/ML IJ SOLN
25.0000 mg | Freq: Once | INTRAMUSCULAR | Status: AC
  Administered 2017-11-02: 25 mg via INTRAVENOUS
  Filled 2017-11-02: qty 1

## 2017-11-02 MED ORDER — IPRATROPIUM-ALBUTEROL 0.5-2.5 (3) MG/3ML IN SOLN
3.0000 mL | Freq: Once | RESPIRATORY_TRACT | Status: AC
  Administered 2017-11-02: 3 mL via RESPIRATORY_TRACT
  Filled 2017-11-02: qty 3

## 2017-11-02 MED ORDER — METOCLOPRAMIDE HCL 5 MG/ML IJ SOLN
10.0000 mg | Freq: Once | INTRAMUSCULAR | Status: AC
  Administered 2017-11-02: 10 mg via INTRAVENOUS
  Filled 2017-11-02: qty 2

## 2017-11-02 MED ORDER — ALBUTEROL SULFATE (2.5 MG/3ML) 0.083% IN NEBU: 5.0000 mg | INHALATION_SOLUTION | Freq: Once | RESPIRATORY_TRACT | Status: DC

## 2017-11-02 NOTE — Consult Note (Signed)
History and Physical    [name] VWU:[id number] DOB: 26-May-1971 DOA: 11/02/2017  PCP: Patient, No Pcp Per [facility]  Patient coming from: Home  Chief Complaint: Shortness of breath with chest tightness  HPI: [name] is a 47 y.o. female with medical history significant for iron deficiency anemia, depression and hypertension who presented to the emergency department with intermittent shortness of breath and chest tightness that progressed over the course of 3 weeks shortly after she had symptoms of sore throat and cough.  She denies any radiation of her symptoms and does not have any alleviating or exacerbating factors and states that she has some mild chest tenderness as well.  She was given some albuterol in the outpatient setting and has used this at home with minimal relief noted thus far.   ED Course: Vital signs are stable and her blood pressure is slightly elevated.  She has been given a course of DuoNeb's in the ED and Toradol with significant relief of her chest pain and shortness of breath.  Troponin of 0 and no other significant findings on laboratory data.  EKG with findings of LVH with sinus rhythm at 83 bpm.  She states that overall she is feeling much better and would like to go home at this time.  Review of Systems: All others reviewed and otherwise negative.  Past Medical History:  Diagnosis Date  . Anxiety   . Back pain, chronic   . Depression   . Hypertension     Past Surgical History:  Procedure Laterality Date  . APPENDECTOMY    . TUBAL LIGATION       reports that she has been smoking cigarettes.  She has been smoking about 1.00 pack per day. She has never used smokeless tobacco. She reports that she drinks alcohol. She reports that she does not use drugs.  Allergies  Allergen Reactions  . Biaxin [Clarithromycin]     Sick on stomach    Family History  Problem Relation Age of Onset  . Arthritis Unknown   . Lung disease Unknown      Prior to Admission medications   Medication Sig Start Date End Date Taking? Authorizing Provider  guaiFENesin (MUCINEX) 600 MG 12 hr tablet Take 2 tablets (1,200 mg total) by mouth 2 (two) times daily. 10/18/17  Yes Idol, Raynelle Fanning, PA-C  omeprazole (PRILOSEC) 20 MG capsule Take 20 mg by mouth daily.   Yes [provider]  albuterol (PROVENTIL HFA;VENTOLIN HFA) 108 (90 Base) MCG/ACT inhaler Inhale 1-2 puffs into the lungs every 6 (six) hours as needed for wheezing or shortness of breath. 11/02/17   Maia Plan, MD    Physical Exam: Vitals:   11/02/17 1800 11/02/17 1830 11/02/17 1900 11/02/17 1930  BP: (!) 153/125 (!) 156/98 (!) 158/91 126/87  Pulse: 84 85 80 83  Resp:  19 13 19   Temp:      TempSrc:      SpO2: 100% 100% 100% 100%  Weight:      Height:        Constitutional: NAD, calm, comfortable Vitals:   11/02/17 1800 11/02/17 1830 11/02/17 1900 11/02/17 1930  BP: (!) 153/125 (!) 156/98 (!) 158/91 126/87  Pulse: 84 85 80 83  Resp:  19 13 19   Temp:      TempSrc:      SpO2: 100% 100% 100% 100%  Weight:      Height:       Eyes: lids and conjunctivae normal ENMT: Mucous  membranes are moist.  Neck: normal, supple Respiratory: clear to auscultation bilaterally. Normal respiratory effort. No accessory muscle use.  No bronchospasms noted, currently on room air. Cardiovascular: Regular rate and rhythm, no murmurs. No extremity edema. Abdomen: no tenderness, no distention. Bowel sounds positive.  Musculoskeletal:  No joint deformity upper and lower extremities.   Skin: no rashes, lesions, ulcers.  Psychiatric: Normal judgment and insight. Alert and oriented x 3. Normal mood.   Labs on Admission: I have personally reviewed following labs and imaging studies  CBC: Recent Labs  Lab 11/02/17 1811  WBC 5.5  NEUTROABS 3.9  HGB 7.8*  HCT 29.6*  MCV 77.1*  PLT 310   Basic Metabolic Panel: Recent Labs  Lab 11/02/17 1811  NA 137  K 3.7  CL 101  CO2 24  GLUCOSE  87  BUN 15  CREATININE 0.55  CALCIUM 9.3   GFR: Estimated Creatinine Clearance: 88.7 mL/min (by C-G formula based on SCr of 0.55 mg/dL). Liver Function Tests: Recent Labs  Lab 11/02/17 1811  AST 17  ALT 10*  ALKPHOS 87  BILITOT 0.6  PROT 8.2*  ALBUMIN 4.0   No results for input(s): LIPASE, AMYLASE in the last 168 hours. No results for input(s): AMMONIA in the last 168 hours. Coagulation Profile: No results for input(s): INR, PROTIME in the last 168 hours. Cardiac Enzymes: No results for input(s): CKTOTAL, CKMB, CKMBINDEX, TROPONINI in the last 168 hours. BNP (last 3 results) No results for input(s): PROBNP in the last 8760 hours. HbA1C: No results for input(s): HGBA1C in the last 72 hours. CBG: No results for input(s): GLUCAP in the last 168 hours. Lipid Profile: No results for input(s): CHOL, HDL, LDLCALC, TRIG, CHOLHDL, LDLDIRECT in the last 72 hours. Thyroid Function Tests: No results for input(s): TSH, T4TOTAL, FREET4, T3FREE, THYROIDAB in the last 72 hours. Anemia Panel: No results for input(s): VITAMINB12, FOLATE, FERRITIN, TIBC, IRON, RETICCTPCT in the last 72 hours. Urine analysis:    Component Value Date/Time   COLORURINE YELLOW 12/10/2013 1652   APPEARANCEUR CLEAR 12/10/2013 1652   LABSPEC 1.020 12/10/2013 1652   PHURINE 6.5 12/10/2013 1652   GLUCOSEU NEGATIVE 12/10/2013 1652   HGBUR TRACE (A) 12/10/2013 1652   BILIRUBINUR NEGATIVE 12/10/2013 1652   KETONESUR 40 (A) 12/10/2013 1652   PROTEINUR TRACE (A) 12/10/2013 1652   UROBILINOGEN 0.2 12/10/2013 1652   NITRITE NEGATIVE 12/10/2013 1652   LEUKOCYTESUR TRACE (A) 12/10/2013 1652    Radiological Exams on Admission: Dg Chest 2 View  Result Date: 11/02/2017 CLINICAL DATA:  Shortness of breath, chest pain EXAM: CHEST - 2 VIEW COMPARISON:  03/10/2004 FINDINGS: Heart and mediastinal contours are within normal limits. No focal opacities or effusions. No acute bony abnormality. IMPRESSION: No active  cardiopulmonary disease. Electronically Signed   By: Charlett NoseKevin  Dover M.D.   On: 11/02/2017 17:38    EKG: Independently reviewed. LVH with SR at 83bpm.  Assessment/Plan Active Problems:   * No active hospital problems. *    1. Chest pain and dyspnea likely secondary to some mild costochondritis as well as reactive airways disease secondary to recent upper respiratory infection.  I have encouraged the patient and her husband to return to the ED should her symptoms worsen, but I believe that much of her symptoms would be improved with the use of NSAIDs/acetaminophen at home along with albuterol inhaler.  She may also benefit from oral prednisone taper.  She states that she is agreeable to this plan and would like to go home  at this time and will follow up with IM residency clinic at [facility] as well.  No signs of ACS currently noted.  Heart score at best is 3 and patient can be observed in the outpatient setting at this time. 2. Hypertension with LVH.  I would recommend initiation of lisinopril 10 mg on a daily basis with repeat blood pressure check at the IM residency clinic. 3. Tobacco abuse.  Smoking cessation counseling. 4. Iron deficiency anemia.  Currently stable.  Recommend iron supplementation if patient does not already on this and follow-up at IM residency clinic.   Pratik Hoover Brunette DO [facility] Pager [phone]  If 7PM-7AM, please contact night-coverage [email or web address] Password [facility]  11/02/2017, 8:41 PM

## 2017-11-02 NOTE — Congregational Nurse Program (Signed)
Congregational Nurse Program Note  Date of Encounter: 11/02/2017  Past Medical History: Past Medical History:  Diagnosis Date  . Anxiety   . Back pain, chronic   . Depression   . Hypertension     Encounter Details: CNP Questionnaire - 11/02/17 1633      Questionnaire   Patient Status  Not Applicable    Race  White or Caucasian    Location Patient Served At  [facility] [employer]  Not Applicable    Uninsured  Uninsured (NEW 1x/quarter)    Food  No food insecurities    Housing/Utilities  Yes, have permanent housing    Transportation  No transportation needs    Interpersonal Safety  Yes, feel physically and emotionally safe where you currently live    Medication  Yes, have medication insecurities;Provided medication assistance    Referrals  [facility];Primary Care Provider/Clinic;Emergency Department    ED Visit Averted  Not Applicable    Life-Saving Intervention Made  Not Applicable       New Client to [facility] today who called today stating she was seen in the [facility] Emergency room on 10/18/17 and was treated for sinus infection. Client states she finished her antibiotic on 10/24/17 and took the other medications prescribed as well. She reports continuing to feel bad, poor appetite, shortness of Breath, dizziness and headaches. She does report some sore throat, an occasional cough, but non productive. She does report with exertion some chest tightness. None now at rest. She is currently uninsured and has not worked in 3 weeks. She lives with her husband and there is no income in the household. She states her father is helping with utilities and food.    Alert and oriented to person, place and time. Answers questions appropriately. States she is wanting help to get a primary care provider. Client appears tired, flat affect, states she just feels "yucky" and that she reports loss of 20 lbs over the past few weeks. States she just  has no desire to eat and she feels sick and has what she reports as "acid reflux" with eating any food or drink even water.  Left arm blood pressure 174/103 pulse 93, right arm 145/98 pulse 94 all readings sitting with large cuff. O2 sat on room air 99%, respirations non labored at 12. Temp orally 97.8. Lungs decreased breath sounds breathing non labored at rest. Client also states that she completed her course of antibiotics as well as taking mucinex and Claritin D, however states that the past several days she has been taking the Claritin D during the daytime and benadryl 2 tablets at night. Client reports that several years ago she had been on medications for her thyroid and high blood pressure, she thinks blood pressure medication was lisinopril but is not sure of any doses. She states she "weaned herself off".  Discussed with client options for primary medical care and [facility] and appointment secured for 11/08/17 at 1:00 pm. However, due to client appearing to feel bad, symptoms of dizziness, shortness of breath and no change since being in ER we will do an MD live visit for guidance and possible treatment. Dr Francesco Runner advised the client to do Flonase burst and directions printed and provided by provider as well as an albuterol inhaler that was electronically prescribed to [employer], Hyman Bower also faxed a one time financial assistance voucher to [employer] with  albuterol inhaler and Flonase.  Dr. Francesco RunnerNorthcote also advised the client to return today to the emergency room on his advice due to her shortness of breath and chest tightness with exertion and no change in how she feels after antibiotics. Client is agreeable. Instructions of MD live visit given to client along with free clinic appointment card with contact information.  Clara Adline PotterGunn will follow up with client tomorrow after ER visit for any further needs. Client also referred to [employer] for food assistance.  Francee NodalPatricia R Tanyah Debruyne RN [facility] [phone]
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
## 2017-11-02 NOTE — ED Triage Notes (Signed)
Pt c/o of sob, dizzy and weakness since x 3 weeks

## 2017-11-02 NOTE — Discharge Instructions (Signed)

## 2017-11-03 ENCOUNTER — Telehealth: Payer: Self-pay

## 2017-11-03 NOTE — Telephone Encounter (Signed)
[facility] with symptoms of upper respirtory infection. Pt had been hospitalized prior to coming to our center and was prescribed an antibiotic which pt stated she finished. MD live was done with pt and MD advised pt to go back to the ER for further assessment which according to chart pt did.   Follow-up call was made with pt today 11/03/17 to see how she was doing. Pt stated she feels a little better than yesterday which pt did sound better when speaking to her. Pt stated she was told that her hemoglobin was low and she said she got some iron supplement to bring her iron levels up. She also has been using the the inhaler that was prescribed by [facility] Live's MD. Reminded pt of [facility] and she says she plans on making that appointment. Pt stated she was told to see a cardiologist. Advised pt she can call or wait until she see's her PCP on 11/08/17 at 1 pm and that was her decision to make. Pt was grateful for all the nurses did [facility] did. Advised pt to give us a call if she gets worse while she waits for her PCP appointment. Pt understood.   Kortnie Stovall R. Lesly Joslyn LPN [phone]

## 2017-11-08 ENCOUNTER — Encounter (HOSPITAL_COMMUNITY): Payer: Self-pay | Admitting: Emergency Medicine

## 2017-11-08 ENCOUNTER — Encounter: Payer: Self-pay | Admitting: Physician Assistant

## 2017-11-08 ENCOUNTER — Other Ambulatory Visit: Payer: Self-pay

## 2017-11-08 ENCOUNTER — Emergency Department (HOSPITAL_COMMUNITY)
Admission: EM | Admit: 2017-11-08 | Discharge: 2017-11-08 | Disposition: A | Discharge status: Home / Self Care | Payer: Self-pay | Attending: Emergency Medicine | Admitting: Emergency Medicine

## 2017-11-08 ENCOUNTER — Ambulatory Visit: Payer: Self-pay | Admitting: Physician Assistant

## 2017-11-08 VITALS — BP 152/101 | HR 99 | Temp 97.9°F | Ht 61.5 in | Wt 184.2 lb

## 2017-11-08 DIAGNOSIS — D509 Iron deficiency anemia, unspecified: Secondary | ICD-10-CM | POA: Insufficient documentation

## 2017-11-08 DIAGNOSIS — K219 Gastro-esophageal reflux disease without esophagitis: Secondary | ICD-10-CM

## 2017-11-08 DIAGNOSIS — E079 Disorder of thyroid, unspecified: Secondary | ICD-10-CM | POA: Insufficient documentation

## 2017-11-08 DIAGNOSIS — F17210 Nicotine dependence, cigarettes, uncomplicated: Secondary | ICD-10-CM | POA: Insufficient documentation

## 2017-11-08 DIAGNOSIS — I10 Essential (primary) hypertension: Secondary | ICD-10-CM | POA: Insufficient documentation

## 2017-11-08 DIAGNOSIS — R9431 Abnormal electrocardiogram [ECG] [EKG]: Secondary | ICD-10-CM

## 2017-11-08 DIAGNOSIS — R079 Chest pain, unspecified: Secondary | ICD-10-CM

## 2017-11-08 DIAGNOSIS — R0602 Shortness of breath: Secondary | ICD-10-CM

## 2017-11-08 DIAGNOSIS — Z79899 Other long term (current) drug therapy: Secondary | ICD-10-CM | POA: Insufficient documentation

## 2017-11-08 DIAGNOSIS — Z7689 Persons encountering health services in other specified circumstances: Secondary | ICD-10-CM

## 2017-11-08 DIAGNOSIS — R55 Syncope and collapse: Secondary | ICD-10-CM

## 2017-11-08 DIAGNOSIS — R1084 Generalized abdominal pain: Secondary | ICD-10-CM

## 2017-11-08 DIAGNOSIS — R638 Other symptoms and signs concerning food and fluid intake: Secondary | ICD-10-CM | POA: Insufficient documentation

## 2017-11-08 DIAGNOSIS — R42 Dizziness and giddiness: Secondary | ICD-10-CM | POA: Insufficient documentation

## 2017-11-08 LAB — BASIC METABOLIC PANEL
ANION GAP: 11 (ref 5–15)
BUN: 16 mg/dL (ref 6–20)
CO2: 23 mmol/L (ref 22–32)
Calcium: 9.1 mg/dL (ref 8.9–10.3)
Chloride: 104 mmol/L (ref 101–111)
Creatinine, Ser: 0.66 mg/dL (ref 0.44–1.00)
GFR calc Af Amer: >60 mL/min (ref >60)
GFR calc non Af Amer: >60 mL/min (ref >60)
GLUCOSE: 95 mg/dL (ref 65–99)
POTASSIUM: 3.6 mmol/L (ref 3.5–5.1)
Sodium: 138 mmol/L (ref 135–145)

## 2017-11-08 LAB — CBC WITH DIFFERENTIAL/PLATELET
BASOS ABS: 0.1 10*3/uL (ref 0.0–0.1)
Basophils Relative: 2 %
Eosinophils Absolute: 0.1 10*3/uL (ref 0.0–0.7)
Eosinophils Relative: 2 %
HCT: 29.4 % — ABNORMAL LOW (ref 36.0–46.0)
Hemoglobin: 8 g/dL — ABNORMAL LOW (ref 12.0–15.0)
LYMPHS PCT: 27 %
Lymphs Abs: 1.1 10*3/uL (ref 0.7–4.0)
MCH: 20.9 pg — ABNORMAL LOW (ref 26.0–34.0)
MCHC: 27.2 g/dL — ABNORMAL LOW (ref 30.0–36.0)
MCV: 76.8 fL — AB (ref 78.0–100.0)
MONO ABS: 0.4 10*3/uL (ref 0.1–1.0)
Monocytes Relative: 11 %
NEUTROS ABS: 2.4 10*3/uL (ref 1.7–7.7)
Neutrophils Relative %: 58 %
Platelets: 332 10*3/uL (ref 150–400)
RBC: 3.83 MIL/uL — AB (ref 3.87–5.11)
RDW: 18.6 % — AB (ref 11.5–15.5)
WBC: 4.1 10*3/uL (ref 4.0–10.5)

## 2017-11-08 LAB — URINALYSIS, ROUTINE W REFLEX MICROSCOPIC
Bilirubin Urine: NEGATIVE
GLUCOSE, UA: NEGATIVE mg/dL
HGB URINE DIPSTICK: NEGATIVE
Ketones, ur: NEGATIVE mg/dL
NITRITE: NEGATIVE
PROTEIN: NEGATIVE mg/dL
Specific Gravity, Urine: 1.005 (ref 1.005–1.030)
pH: 7 (ref 5.0–8.0)

## 2017-11-08 LAB — ABO/RH: ABO/RH(D): O NEG

## 2017-11-08 LAB — TROPONIN I: Troponin I: <0.03 ng/mL (ref <0.03)

## 2017-11-08 LAB — TSH: TSH: 1.673 u[IU]/mL (ref 0.350–4.500)

## 2017-11-08 LAB — POC OCCULT BLOOD, ED: Fecal Occult Bld: NEGATIVE

## 2017-11-08 LAB — PREPARE RBC (CROSSMATCH)

## 2017-11-08 MED ORDER — SODIUM CHLORIDE 0.9 % IV SOLN
Freq: Once | INTRAVENOUS | Status: AC
  Administered 2017-11-08: 20:00:00 via INTRAVENOUS

## 2017-11-08 MED ORDER — MORPHINE SULFATE (PF) 4 MG/ML IV SOLN
4.0000 mg | Freq: Once | INTRAVENOUS | Status: AC
  Administered 2017-11-08: 4 mg via INTRAVENOUS

## 2017-11-08 MED ORDER — KETOROLAC TROMETHAMINE 30 MG/ML IJ SOLN
30.0000 mg | Freq: Once | INTRAMUSCULAR | Status: AC
  Administered 2017-11-08: 30 mg via INTRAVENOUS
  Filled 2017-11-08: qty 1

## 2017-11-08 MED ORDER — OMEPRAZOLE 40 MG PO CPDR: 40.0000 mg | DELAYED_RELEASE_CAPSULE | Freq: Every day | ORAL | 1 refills | Status: DC

## 2017-11-08 MED ORDER — FERROUS SULFATE 325 (65 FE) MG PO TABS: 325.0000 mg | ORAL_TABLET | Freq: Every day | ORAL | 0 refills | Status: DC

## 2017-11-08 MED ORDER — MORPHINE SULFATE (PF) 4 MG/ML IV SOLN
INTRAVENOUS | Status: AC
  Filled 2017-11-08: qty 1

## 2017-11-08 MED ORDER — ONDANSETRON HCL 4 MG/2ML IJ SOLN
4.0000 mg | Freq: Once | INTRAMUSCULAR | Status: AC
  Administered 2017-11-08: 4 mg via INTRAVENOUS
  Filled 2017-11-08: qty 2

## 2017-11-08 MED ORDER — LISINOPRIL 5 MG PO TABS: 5.0000 mg | ORAL_TABLET | Freq: Every day | ORAL | 1 refills | Status: DC

## 2017-11-08 MED ORDER — SODIUM CHLORIDE 0.9 % IV BOLUS
1000.0000 mL | Freq: Once | INTRAVENOUS | Status: AC
  Administered 2017-11-08: 1000 mL via INTRAVENOUS

## 2017-11-08 NOTE — ED Notes (Signed)
Pt reports weakness for approx a month. SOB with exertion. Pt pale in color

## 2017-11-08 NOTE — ED Notes (Signed)
Pt reports HA and is requesting pain and nausea med

## 2017-11-08 NOTE — Progress Notes (Signed)
BP (!) 152/101 (BP Location: Right Arm, Patient Position: Sitting, Cuff Size: Normal)   Pulse 99   Temp 97.9 F (36.6 C)   Ht 5' 1.5" (1.562 m)   Wt 184 lb 4 oz (83.6 kg)   SpO2 98%   BMI 34.25 kg/m    Subjective:    Patient ID: [name], female    DOB: 1970-11-29, 47 y.o.   MRN: [id number]  HPI: [name] is a 47 y.o. female presenting on 11/08/2017 for New Patient (Initial Visit) (pt states she has not had PCP in a long time. pt states ER was talking about blood transfusion due to low iron. pt states she feels very tired, sees spots when she walks and feels she is going to pass out, and SOB)   HPI   Chief Complaint  Patient presents with  . New Patient (Initial Visit)    pt states she has not had PCP in a long time. pt states ER was talking about blood transfusion due to low iron. pt states she feels very tired, sees spots when she walks and feels she is going to pass out, and SOB     Pt went to [facility] in the past but says it's been a few years.  Pt just started on the iron last week.  She says she didn't start it in 2017 when diagnosed with iron def anemia and was told to take it.   Pt was given fereheme during that admission in 2017.   Pt feels bad and feels like she is going to pass out when she walks.  She also get sob and CP.  Pt had abnormal EKG in ER last week.  Pt was working at [employer] until about a month ago when she got sinus infection and she thinks she has been replaced now.    She c/o acid reflux  Relevant past medical, surgical, family and social history reviewed and updated as indicated. Interim medical history since our last visit reviewed. Allergies and medications reviewed and updated.   Current Outpatient Medications:  .  albuterol (PROVENTIL HFA;VENTOLIN HFA) 108 (90 Base) MCG/ACT inhaler, Inhale 1-2 puffs into the lungs every 6 (six) hours as needed for wheezing or shortness of breath., Disp: 1 Inhaler, Rfl: 0 .  IRON PO, Take 1  tablet by mouth daily., Disp: , Rfl:  .  loratadine (CLARITIN) 10 MG tablet, Take 10 mg by mouth daily., Disp: , Rfl:  .  omeprazole (PRILOSEC) 20 MG capsule, Take 20 mg by mouth daily., Disp: , Rfl:     Review of Systems  Constitutional: Positive for diaphoresis, fatigue, fever and unexpected weight change. Negative for appetite change and chills.  HENT: Positive for congestion, sneezing and sore throat. Negative for dental problem, drooling, ear pain, facial swelling, hearing loss, mouth sores, trouble swallowing and voice change.   Eyes: Positive for discharge, redness, itching and visual disturbance. Negative for pain.  Respiratory: Positive for cough, shortness of breath and wheezing. Negative for choking.   Cardiovascular: Positive for chest pain. Negative for palpitations and leg swelling.  Gastrointestinal: Negative for abdominal pain, blood in stool, constipation, diarrhea and vomiting.  Endocrine: Negative for cold intolerance, heat intolerance and polydipsia.  Genitourinary: Negative for decreased urine volume, dysuria and hematuria.  Musculoskeletal: Negative for arthralgias, back pain and gait problem.  Skin: Negative for rash.  Allergic/Immunologic: Negative for environmental allergies.  Neurological: Positive for light-headedness and headaches. Negative for seizures and syncope.  Hematological: Positive for adenopathy.  Psychiatric/Behavioral: Negative for agitation, dysphoric mood and suicidal ideas. The patient is nervous/anxious.     Per HPI unless specifically indicated above     Objective:    BP (!) 152/101 (BP Location: Right Arm, Patient Position: Sitting, Cuff Size: Normal)   Pulse 99   Temp 97.9 F (36.6 C)   Ht 5' 1.5" (1.562 m)   Wt 184 lb 4 oz (83.6 kg)   SpO2 98%   BMI 34.25 kg/m   Wt Readings from Last 3 Encounters:  11/08/17 184 lb 4 oz (83.6 kg)  11/02/17 183 lb (83 kg)  11/02/17 183 lb 9.6 oz (83.3 kg)   Orthostatic VS for the past 24 hrs:   BP- Lying Pulse- Lying BP- Sitting Pulse- Sitting BP- Standing at 0 minutes Pulse- Standing at 0 minutes  11/08/17 1333 (!) 140/99 80 (!) 145/107 87 (!) 144/101 95       Physical Exam  Constitutional: She is oriented to person, place, and time. She appears well-developed and well-nourished. She has a sickly appearance.  HENT:  Head: Normocephalic and atraumatic.  Mouth/Throat: Oropharynx is clear and moist. No oropharyngeal exudate.  Eyes: Pupils are equal, round, and reactive to light. Conjunctivae and EOM are normal.  Neck: Neck supple. No thyromegaly present.  Cardiovascular: Normal rate and regular rhythm.  Pulmonary/Chest: Effort normal and breath sounds normal.  Abdominal: Soft. Bowel sounds are normal. She exhibits no pulsatile midline mass and no mass. There is no hepatosplenomegaly. There is generalized tenderness (mild). There is no rigidity and no guarding.  Musculoskeletal: She exhibits no edema.  Lymphadenopathy:    She has no cervical adenopathy.  Neurological: She is alert and oriented to person, place, and time. Gait normal.  Skin: Skin is warm and dry.  Psychiatric: She has a normal mood and affect. Her behavior is normal.  Vitals reviewed.   Results for orders placed or performed during the hospital encounter of 11/02/17  Comprehensive metabolic panel  Result Value Ref Range   Sodium 137 135 - 145 mmol/L   Potassium 3.7 3.5 - 5.1 mmol/L   Chloride 101 101 - 111 mmol/L   CO2 24 22 - 32 mmol/L   Glucose, Bld 87 65 - 99 mg/dL   BUN 15 6 - 20 mg/dL   Creatinine, Ser 1.61 0.44 - 1.00 mg/dL   Calcium 9.3 8.9 - 09.6 mg/dL   Total Protein 8.2 (H) 6.5 - 8.1 g/dL   Albumin 4.0 3.5 - 5.0 g/dL   AST 17 15 - 41 U/L   ALT 10 (L) 14 - 54 U/L   Alkaline Phosphatase 87 38 - 126 U/L   Total Bilirubin 0.6 0.3 - 1.2 mg/dL   GFR calc non Af Amer >60 >60 mL/min   GFR calc Af Amer >60 >60 mL/min   Anion gap 12 5 - 15  CBC with Differential  Result Value Ref Range   WBC 5.5  4.0 - 10.5 K/uL   RBC 3.84 (L) 3.87 - 5.11 MIL/uL   Hemoglobin 7.8 (L) 12.0 - 15.0 g/dL   HCT 04.5 (L) 40.9 - 81.1 %   MCV 77.1 (L) 78.0 - 100.0 fL   MCH 20.3 (L) 26.0 - 34.0 pg   MCHC 26.4 (L) 30.0 - 36.0 g/dL   RDW 91.4 (H) 78.2 - 95.6 %   Platelets 310 150 - 400 K/uL   Neutrophils Relative % 69 %   Neutro Abs 3.9 1.7 - 7.7 K/uL   Lymphocytes Relative 18 %  Lymphs Abs 1.0 0.7 - 4.0 K/uL   Monocytes Relative 11 %   Monocytes Absolute 0.6 0.1 - 1.0 K/uL   Eosinophils Relative 1 %   Eosinophils Absolute 0.0 0.0 - 0.7 K/uL   Basophils Relative 1 %   Basophils Absolute 0.0 0.0 - 0.1 K/uL  I-stat troponin, ED  Result Value Ref Range   Troponin i, poc 0.00 0.00 - 0.08 ng/mL   Comment 3              Assessment & Plan:   Encounter Diagnoses  Name Primary?  . Encounter to establish care Yes  . Iron deficiency anemia, unspecified iron deficiency anemia type   . Near syncope   . SOB (shortness of breath)   . Gastroesophageal reflux disease, esophagitis presence not specified   . Generalized abdominal pain   . Abnormal EKG   . Chest pain, unspecified type      -discussed with pt.  In light of how poorly she feels and being presyncope with low Hg, she may need transfusion.  Pt to go to ER.   -Refer to hematology and cardiology.  She will likely need referral to GI in the near future -Start Low dose lisinopril.  Will not start at  due to anemia and current symptoms.  Discussed with pt will likely increase dosage in several weeks to a month -pt given charity care application -will order baseline labs like lipids prior to next OV -pt to follow up 2 week.
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
## 2017-11-08 NOTE — Discharge Instructions (Signed)
Iron pills daily  Please obtain all of your results from medical records or have your doctors office obtain the results - share them with your doctor - you should be seen at your doctors office in the next 2 days. Call today to arrange your follow up. Take the medications as prescribed. Please review all of the medicines and only take them if you do not have an allergy to them. Please be aware that if you are taking birth control pills, taking other prescriptions, ESPECIALLY ANTIBIOTICS may make the birth control ineffective - if this is the case, either do not engage in sexual activity or use alternative methods of birth control such as condoms until you have finished the medicine and your family doctor says it is OK to restart them. If you are on a blood thinner such as COUMADIN, be aware that any other medicine that you take may cause the coumadin to either work too much, or not enough - you should have your coumadin level rechecked in next 7 days if this is the case.  ?  It is also a possibility that you have an allergic reaction to any of the medicines that you have been prescribed - Everybody reacts differently to medications and while MOST people have no trouble with most medicines, you may have a reaction such as nausea, vomiting, rash, swelling, shortness of breath. If this is the case, please stop taking the medicine immediately and contact your physician.  ?  You should return to the ER if you develop severe or worsening symptoms.

## 2017-11-08 NOTE — ED Notes (Signed)
Blood paused, IV flushed, Morphine given, IV flushed, blood restarted

## 2017-11-08 NOTE — ED Triage Notes (Signed)
Pt reports generalized weakness, fatigue, and SOB x 1 month. Pt evaluated in ED last week, Hbg was 7.8. No transfusion. States she was discharged, but symptoms have not improved. [facility]. Pt pale in triage.

## 2017-11-09 ENCOUNTER — Other Ambulatory Visit: Payer: Self-pay | Admitting: Physician Assistant

## 2017-11-09 ENCOUNTER — Telehealth: Payer: Self-pay | Admitting: Student

## 2017-11-09 DIAGNOSIS — D509 Iron deficiency anemia, unspecified: Secondary | ICD-10-CM

## 2017-11-09 DIAGNOSIS — Z13220 Encounter for screening for lipoid disorders: Secondary | ICD-10-CM

## 2017-11-09 LAB — TYPE AND SCREEN
ABO/RH(D): O NEG
Antibody Screen: NEGATIVE
UNIT DIVISION: 0

## 2017-11-09 LAB — BPAM RBC
BLOOD PRODUCT EXPIRATION DATE: 201905212359
ISSUE DATE / TIME: 201904302009
Unit Type and Rh: 9500

## 2017-11-09 NOTE — Telephone Encounter (Signed)
-----   Message from Jacquelin Hawking, [place] sent at 11/09/2017  7:56 AM EDT ----- Please call pt.  Have her get fasting labs drawn one day next week (May 6-10) in expectation of her follow up appointment on May 14  thanks

## 2017-11-10 ENCOUNTER — Encounter: Payer: Self-pay | Admitting: Cardiovascular Disease

## 2017-11-10 LAB — URINE CULTURE

## 2017-11-17 ENCOUNTER — Encounter: Payer: Self-pay | Admitting: Cardiovascular Disease

## 2017-11-21 NOTE — Telephone Encounter (Signed)
Unable to contact patient.

## 2017-11-22 ENCOUNTER — Encounter: Payer: Self-pay | Admitting: Physician Assistant

## 2017-11-22 ENCOUNTER — Ambulatory Visit: Payer: Self-pay | Admitting: Physician Assistant

## 2017-11-22 VITALS — BP 160/98 | HR 97 | Temp 98.1°F | Ht 61.5 in | Wt 193.0 lb

## 2017-11-22 DIAGNOSIS — D509 Iron deficiency anemia, unspecified: Secondary | ICD-10-CM

## 2017-11-22 DIAGNOSIS — I10 Essential (primary) hypertension: Secondary | ICD-10-CM

## 2017-11-22 MED ORDER — LISINOPRIL 10 MG PO TABS: 10.0000 mg | ORAL_TABLET | Freq: Every day | ORAL | 1 refills | Status: DC

## 2017-11-22 NOTE — Progress Notes (Signed)
BP (!) 160/98 (BP Location: Left Arm, Patient Position: Sitting, Cuff Size: Normal)   Pulse 97   Temp 98.1 F (36.7 C)   Ht 5' 1.5" (1.562 m)   Wt 193 lb (87.5 kg)   SpO2 93%   BMI 35.88 kg/m    Subjective:    Patient ID: [name], female    DOB: 1970/10/20, 47 y.o.   MRN: [id number]  HPI: [name] is a 47 y.o. female presenting on 11/22/2017 for Follow-up   HPI  Pt did not get labs drawn  She did not turn in [facility] application  She is feeling much better.  She got transfused 1 unit blood.    She has feeling much better but still gets a little bit dizzy.  Still some sob. She is still smoking.    Pt has appointment later this month with cardiology to evaluate abnormal ekg.      Relevant past medical, surgical, family and social history reviewed and updated as indicated. Interim medical history since our last visit reviewed. Allergies and medications reviewed and updated.   Current Outpatient Medications:  .  albuterol (PROVENTIL HFA;VENTOLIN HFA) 108 (90 Base) MCG/ACT inhaler, Inhale 1-2 puffs into the lungs every 6 (six) hours as needed for wheezing or shortness of breath., Disp: 1 Inhaler, Rfl: 0 .  fluticasone (FLONASE) 50 MCG/ACT nasal spray, Place 2 sprays into both nostrils daily., Disp: , Rfl:  .  lisinopril (PRINIVIL,ZESTRIL) 5 MG tablet, Take 1 tablet (5 mg total) by mouth daily., Disp: 30 tablet, Rfl: 1 .  loratadine (CLARITIN) 10 MG tablet, Take 10 mg by mouth daily., Disp: , Rfl:  .  omeprazole (PRILOSEC) 40 MG capsule, Take 1 capsule (40 mg total) by mouth daily., Disp: 30 capsule, Rfl: 1 .  ferrous sulfate 325 (65 FE) MG tablet, Take 1 tablet (325 mg total) by mouth daily. (Patient not taking: Reported on 11/22/2017), Disp: 30 tablet, Rfl: 0 .  IRON PO, Take 1 tablet by mouth daily., Disp: , Rfl:    Review of Systems  Constitutional: Positive for diaphoresis and fatigue. Negative for appetite change, chills, fever and unexpected weight  change.  HENT: Negative for congestion, drooling, ear pain, facial swelling, hearing loss, mouth sores, sneezing, sore throat, trouble swallowing and voice change.   Eyes: Positive for itching. Negative for pain, discharge, redness and visual disturbance.  Respiratory: Positive for shortness of breath. Negative for cough, choking and wheezing.   Cardiovascular: Positive for chest pain, palpitations and leg swelling.  Gastrointestinal: Positive for abdominal pain. Negative for blood in stool, constipation, diarrhea and vomiting.  Endocrine: Negative for cold intolerance, heat intolerance and polydipsia.  Genitourinary: Negative for decreased urine volume, dysuria and hematuria.  Musculoskeletal: Negative for arthralgias, back pain and gait problem.  Skin: Negative for rash.  Allergic/Immunologic: Positive for environmental allergies.  Neurological: Positive for light-headedness and headaches. Negative for seizures and syncope.  Hematological: Negative for adenopathy.  Psychiatric/Behavioral: Negative for agitation, dysphoric mood and suicidal ideas. The patient is not nervous/anxious.     Per HPI unless specifically indicated above     Objective:    BP (!) 160/98 (BP Location: Left Arm, Patient Position: Sitting, Cuff Size: Normal)   Pulse 97   Temp 98.1 F (36.7 C)   Ht 5' 1.5" (1.562 m)   Wt 193 lb (87.5 kg)   SpO2 93%   BMI 35.88 kg/m   Wt Readings from Last 3 Encounters:  11/22/17 193 lb (87.5 kg)  11/08/17 184 lb (83.5 kg)  11/08/17 184 lb 4 oz (83.6 kg)    Physical Exam  Constitutional: She is oriented to person, place, and time. She appears well-developed and well-nourished.  HENT:  Head: Normocephalic and atraumatic.  Neck: Neck supple.  Cardiovascular: Normal rate and regular rhythm.  Pulmonary/Chest: Effort normal and breath sounds normal.  Abdominal: Soft. Bowel sounds are normal. She exhibits no mass. There is no hepatosplenomegaly. There is no tenderness.   Musculoskeletal: She exhibits no edema.  Lymphadenopathy:    She has no cervical adenopathy.  Neurological: She is alert and oriented to person, place, and time.  Skin: Skin is warm and dry.  Psychiatric: She has a normal mood and affect. Her behavior is normal.  Vitals reviewed.       Assessment & Plan:   Encounter Diagnoses  Name Primary?  . Iron deficiency anemia, unspecified iron deficiency anemia type Yes  . Essential hypertension      -pt to get fasting labs -pt encouraged to Turn in charity care application -will incrase lisinopril to  -encouraged pt to Take the iron.  Also counseled on iron-rich diet and gave handout -Pt to follow up 1 month.  RTO sooner prn

## 2017-11-22 NOTE — Patient Instructions (Signed)
Iron-Rich Diet Iron is a mineral that helps your body to produce hemoglobin. Hemoglobin is a protein in your red blood cells that carries oxygen to your body's tissues. Eating too little iron may cause you to feel weak and tired, and it can increase your risk for infection. Eating enough iron is necessary for your body's metabolism, muscle function, and nervous system. Iron is naturally found in many foods. It can also be added to foods or fortified in foods. There are two types of dietary iron:  Heme iron. Heme iron is absorbed by the body more easily than nonheme iron. Heme iron is found in meat, poultry, and fish.  Nonheme iron. Nonheme iron is found in dietary supplements, iron-fortified grains, beans, and vegetables.  You may need to follow an iron-rich diet if:  You have been diagnosed with iron deficiency or iron-deficiency anemia.  You have a condition that prevents you from absorbing dietary iron, such as: ? Infection in your intestines. ? Celiac disease. This involves long-lasting (chronic) inflammation of your intestines.  You do not eat enough iron.  You eat a diet that is high in foods that impair iron absorption.  You have lost a lot of blood.  You have heavy bleeding during your menstrual cycle.  You are pregnant.  What is my plan? Your health care provider may help you to determine how much iron you need per day based on your condition. Generally, when a person consumes sufficient amounts of iron in the diet, the following iron needs are met:  Men. ? 14-18 years old: 11 mg per day. ? 19-50 years old: 8 mg per day.  Women. ? 14-18 years old: 15 mg per day. ? 19-50 years old: 18 mg per day. ? Over 50 years old: 8 mg per day. ? Pregnant women: 27 mg per day. ? Breastfeeding women: 9 mg per day.  What do I need to know about an iron-rich diet?  Eat fresh fruits and vegetables that are high in vitamin C along with foods that are high in iron. This will help  increase the amount of iron that your body absorbs from food, especially with foods containing nonheme iron. Foods that are high in vitamin C include oranges, peppers, tomatoes, and mango.  Take iron supplements only as directed by your health care provider. Overdose of iron can be life-threatening. If you were prescribed iron supplements, take them with orange juice or a vitamin C supplement.  Cook foods in pots and pans that are made from iron.  Eat nonheme iron-containing foods alongside foods that are high in heme iron. This helps to improve your iron absorption.  Certain foods and drinks contain compounds that impair iron absorption. Avoid eating these foods in the same meal as iron-rich foods or with iron supplements. These include: ? Coffee, black tea, and red wine. ? Milk, dairy products, and foods that are high in calcium. ? Beans, soybeans, and peas. ? Whole grains.  When eating foods that contain both nonheme iron and compounds that impair iron absorption, follow these tips to absorb iron better. ? Soak beans overnight before cooking. ? Soak whole grains overnight and drain them before using. ? Ferment flours before baking, such as using yeast in bread dough. What foods can I eat? Grains Iron-fortified breakfast cereal. Iron-fortified whole-wheat bread. Enriched rice. Sprouted grains. Vegetables Spinach. Potatoes with skin. Green peas. Broccoli. Red and green bell peppers. Fermented vegetables. Fruits Prunes. Raisins. Oranges. Strawberries. Mango. Grapefruit. Meats and Other Protein Sources   Beef liver. Oysters. Beef. Shrimp. [place]. Chicken. [place]. Sardines. Chickpeas. Nuts. Tofu. Beverages Tomato juice. Fresh orange juice. Prune juice. Hibiscus tea. Fortified instant breakfast shakes. Condiments Tahini. Fermented soy sauce. Sweets and Desserts Black-strap molasses. Other Wheat germ. The items listed above may not be a complete list of recommended foods or beverages.  Contact your dietitian for more options. What foods are not recommended? Grains Whole grains. Bran cereal. Bran flour. Oats. Vegetables Artichokes. Brussels sprouts. Kale. Fruits Blueberries. Raspberries. Strawberries. Figs. Meats and Other Protein Sources Soybeans. Products made from soy protein. Dairy Milk. Cream. Cheese. Yogurt. Cottage cheese. Beverages Coffee. Black tea. Red wine. Sweets and Desserts Cocoa. Chocolate. Ice cream. Other Basil. Oregano. Parsley. The items listed above may not be a complete list of foods and beverages to avoid. Contact your dietitian for more information. This information is not intended to replace advice given to you by your health care provider. Make sure you discuss any questions you have with your health care provider. Document Released: 02/09/2005 Document Revised: 01/16/2016 Document Reviewed: 01/23/2014 Elsevier Interactive Patient Education  [employer].

## 2017-11-28 ENCOUNTER — Telehealth: Payer: Self-pay | Admitting: Student

## 2017-11-28 NOTE — Telephone Encounter (Signed)
Pt was asked to get fasting labs done on 11-22-17, but patient has not done so yet.  Pt was called on 11-28-17 to advise her to get labs drawn, but her voicemail was not set up. Will call again at a later time.

## 2017-12-01 ENCOUNTER — Encounter: Payer: Self-pay | Admitting: Cardiovascular Disease

## 2017-12-01 ENCOUNTER — Ambulatory Visit (INDEPENDENT_AMBULATORY_CARE_PROVIDER_SITE_OTHER): Payer: Self-pay | Admitting: Cardiovascular Disease

## 2017-12-01 VITALS — BP 108/78 | HR 99 | Ht 61.0 in | Wt 195.0 lb

## 2017-12-01 DIAGNOSIS — R55 Syncope and collapse: Secondary | ICD-10-CM

## 2017-12-01 NOTE — Patient Instructions (Signed)
Medication Instructions:  Your physician recommends that you continue on your current medications as directed. Please refer to the Current Medication list given to you today.   Labwork: NONE  Testing/Procedures: NONE  Follow-Up: Your physician recommends that you schedule a follow-up appointment in: AS NEEDED      Any Other Special Instructions Will Be Listed Below (If Applicable).     If you need a refill on your cardiac medications before your next appointment, please call your pharmacy.   

## 2017-12-01 NOTE — Progress Notes (Signed)
Cardiology Office Note   Date:  12/01/2017   ID:  Lousie Calico, DOB 29-Aug-1970, MRN [id number]  PCP:  Jacquelin Hawking, PA-C  Cardiologist:   Charlton Haws, MD   No chief complaint on file.     History of Present Illness: [name] is a 47 y.o. female who presents for consultation regarding pre syncope and dyspnea Referred by Jacquelin Hawking PA-C Patient has known symptomatic iron deficiency anemia. Hct 29.6 etiology not clear has had before dating back to 2017.  She received a blood transfusion on 11/09/17  Occult blood sample negative has not seen GI but has diagnosis of GERD and reflux disease Hypothyroid with TSH normal 11/08/17    Feels better after transfusion. Menses not heavy anymore no blood in stool. No chest pain palpitations or PND. Less dyspnea After transfusion     Past Medical History:  Diagnosis Date  . Anemia   . Anxiety   . Back pain, chronic   . Depression   . GERD (gastroesophageal reflux disease)   . Hypertension   . Thyroid disease     Past Surgical History:  Procedure Laterality Date  . APPENDECTOMY    . TUBAL LIGATION       Current Outpatient Medications  Medication Sig Dispense Refill  . albuterol (PROVENTIL HFA;VENTOLIN HFA) 108 (90 Base) MCG/ACT inhaler Inhale 1-2 puffs into the lungs every 6 (six) hours as needed for wheezing or shortness of breath. 1 Inhaler 0  . ferrous sulfate 325 (65 FE) MG tablet Take 1 tablet (325 mg total) by mouth daily. 30 tablet 0  . fluticasone (FLONASE) 50 MCG/ACT nasal spray Place 2 sprays into both nostrils daily.    . IRON PO Take 1 tablet by mouth daily.    Marland Kitchen lisinopril (PRINIVIL,ZESTRIL) 10 MG tablet Take 1 tablet (10 mg total) by mouth daily. 30 tablet 1  . loratadine (CLARITIN) 10 MG tablet Take 10 mg by mouth daily.    Marland Kitchen omeprazole (PRILOSEC) 40 MG capsule Take 1 capsule (40 mg total) by mouth daily. 30 capsule 1   No current facility-administered medications for this visit.     Allergies:    Biaxin [clarithromycin]    Social History:  The patient  reports that she has been smoking cigarettes.  She has a 5.00 pack-year smoking history. She has never used smokeless tobacco. She reports that she drinks alcohol. She reports that she does not use drugs.   Family History:  The patient's family history includes COPD in her mother; Cancer in her father; Hypertension in her mother; Stroke in her mother.    ROS:  Please see the history of present illness.   Otherwise, review of systems are positive for none.   All other systems are reviewed and negative.    PHYSICAL EXAM: VS:  Ht  (1.549 m)   Wt 195 lb (88.5 kg)   BMI 36.84 kg/m  , BMI Body mass index is 36.84 kg/m. Affect appropriate Healthy:  appears stated age HEENT: normal Neck supple with no adenopathy JVP normal no bruits no thyromegaly Lungs clear with no wheezing and good diaphragmatic motion Heart:  S1/S2 no murmur, no rub, gallop or click PMI normal Abdomen: benighn, BS positve, no tenderness, no AAA no bruit.  No HSM or HJR Distal pulses intact with no bruits No edema Neuro non-focal Skin warm and dry No muscular weakness    EKG:  11/03/17  SR rate 91  LVH with strain inferolateral T wave inversions  Recent Labs: 11/02/2017: ALT 10 11/08/2017: BUN 16; Creatinine, Ser 0.66; Hemoglobin 8.0; Platelets 332; Potassium 3.6; Sodium 138; TSH 1.673    Lipid Panel No results found for: CHOL, TRIG, HDL, CHOLHDL, VLDL, LDLCALC, LDLDIRECT    Wt Readings from Last 3 Encounters:  12/01/17 195 lb (88.5 kg)  11/22/17 193 lb (87.5 kg)  11/08/17 184 lb (83.5 kg)      Other studies Reviewed: Additional studies/ records that were reviewed today include: Notes from primary , labs ECG .    ASSESSMENT AND PLAN:  1.  Dyspnea:  Related to symptomatic anemia. Normal lung / cardiac exam. Will order TTE 2. Anemia:  PLT/WBC normal so hopefully not BM issue to see hematology continue oral iron  3. GERD:  No blood in  stool but needs to see GI consider EGD/Colon for symptoms and anemia Continue prilosec  Current medicines are reviewed at length with the patient today.  The patient does not have concerns regarding medicines.  The following changes have been made:  no change  Labs/ tests ordered today include: TTE No orders of the defined types were placed in this encounter.    Disposition:   FU with cardiology PRN      Signed, Charlton Haws, MD  12/01/2017 3:07 PM    [facility] [address] [place], [place], [place]  [address] Phone: [phone]; Fax: [phone]

## 2017-12-12 ENCOUNTER — Ambulatory Visit (HOSPITAL_COMMUNITY)
Admission: RE | Admit: 2017-12-12 | Discharge: 2017-12-12 | Disposition: A | Discharge status: Home / Self Care | Payer: Self-pay | Source: Ambulatory Visit | Attending: Cardiovascular Disease | Admitting: Cardiovascular Disease

## 2017-12-12 DIAGNOSIS — I10 Essential (primary) hypertension: Secondary | ICD-10-CM | POA: Insufficient documentation

## 2017-12-12 DIAGNOSIS — K219 Gastro-esophageal reflux disease without esophagitis: Secondary | ICD-10-CM | POA: Insufficient documentation

## 2017-12-12 DIAGNOSIS — R55 Syncope and collapse: Secondary | ICD-10-CM | POA: Insufficient documentation

## 2017-12-12 NOTE — Progress Notes (Signed)
*  PRELIMINARY RESULTS* Echocardiogram 2D Echocardiogram has been performed.  Stacey DrainWhite, Eman Rynders J 12/12/2017, 3:00 PM

## 2017-12-16 ENCOUNTER — Ambulatory Visit (HOSPITAL_COMMUNITY): Payer: Self-pay | Admitting: Internal Medicine

## 2017-12-20 ENCOUNTER — Ambulatory Visit: Payer: Self-pay | Admitting: Physician Assistant

## 2017-12-26 ENCOUNTER — Encounter: Payer: Self-pay | Admitting: Physician Assistant

## 2018-11-15 ENCOUNTER — Encounter: Payer: Self-pay | Admitting: Physician Assistant

## 2018-11-16 NOTE — Telephone Encounter (Signed)
Patient Coordinator was given patient message and she will contact patient regarding this.

## 2019-01-08 ENCOUNTER — Ambulatory Visit
Admission: EM | Admit: 2019-01-08 | Discharge: 2019-01-08 | Disposition: A | Discharge status: Home / Self Care | Payer: BC Managed Care – PPO | Attending: Emergency Medicine | Admitting: Emergency Medicine

## 2019-01-08 ENCOUNTER — Other Ambulatory Visit: Payer: Self-pay

## 2019-01-08 DIAGNOSIS — I10 Essential (primary) hypertension: Secondary | ICD-10-CM

## 2019-01-08 DIAGNOSIS — H66001 Acute suppurative otitis media without spontaneous rupture of ear drum, right ear: Secondary | ICD-10-CM | POA: Diagnosis not present

## 2019-01-08 DIAGNOSIS — Z760 Encounter for issue of repeat prescription: Secondary | ICD-10-CM

## 2019-01-08 MED ORDER — OMEPRAZOLE 40 MG PO CPDR: 40.0000 mg | DELAYED_RELEASE_CAPSULE | Freq: Every day | ORAL | 1 refills | Status: DC

## 2019-01-08 MED ORDER — IBUPROFEN 800 MG PO TABS: 800.0000 mg | ORAL_TABLET | Freq: Two times a day (BID) | ORAL | 0 refills | Status: DC | PRN

## 2019-01-08 MED ORDER — LISINOPRIL 10 MG PO TABS: 10.0000 mg | ORAL_TABLET | Freq: Every day | ORAL | 1 refills | Status: DC

## 2019-01-08 MED ORDER — AMOXICILLIN-POT CLAVULANATE 875-125 MG PO TABS: 1.0000 | ORAL_TABLET | Freq: Two times a day (BID) | ORAL | 0 refills | Status: AC

## 2019-01-08 NOTE — Discharge Instructions (Addendum)
Rest and drink plenty of fluids Prescribed augmentin 875 mg twice daily for 10 days Ibuprofen 800 mg prescribed.  Take as instructed for pain. Avoid taking with other antiinflammatories as this may cause GI upset and/or bleed Follow up with PCP as needed.  A list of local PCPs in the area is attached Return here or go to the ER if you have any new or worsening symptoms fever, chills, nausea, vomiting, worsening symptoms despite medications, etc...  Please continue to monitor blood pressure at home and keep a log Eat a well balanced diet of fruits, vegetables and lean meats.  Avoid foods high in fat and salt Drink water.  At least half your body weight in ounces Exercise for at least 30 minutes daily Follow up with PCP for further evaluation and management of high blood pressure Return or go to the ED if you have any new or worsening symptoms such as vision changes, fatigue, dizziness, chest pain, shortness of breath, nausea, swelling in your hands or feet, urinary symptoms, etc...  Omeprazole refilled as well

## 2019-01-08 NOTE — ED Provider Notes (Signed)
[place]   [id number] 01/08/19 Arrival Time: [place] and blood pressure medication refill  SUBJECTIVE: History from: patient.  [name] is a 48 y.o. female hx significant for anemia, anxiety, chronic back pain, depression, GERD, HTN, and thyroid disease, who presents with right ear pain and fullness x 1 week.  Denies a precipitating event, such as swimming or wearing ear plugs.  Patient has tried OTC ear drops and flushes without relief.  Symptoms are made worse with lying down.  Reports similar symptoms in the past.  Complains of decreased hearing on right side.  Denies fever, chills, fatigue, sinus pain, rhinorrhea, ear discharge, sore throat, SOB, wheezing, chest pain, nausea, changes in bowel or bladder habits.    Also presenting for blood pressure medication refill.  Hx of HTN x several years.  States blood pressure on average is 170/110.  Takes lisinopril 10 mg daily.  Does not have a PCP.  Denies HA, vision changes, dizziness, lightheadedness, chest pain, shortness of breath, numbness or tingling in extremities, abdominal pain, changes in bowel or bladder habits.    Also requests prescription for omeprazole.  Has GERD.    ROS: As per HPI.  Past Medical History:  Diagnosis Date  . Anemia   . Anxiety   . Back pain, chronic   . Depression   . GERD (gastroesophageal reflux disease)   . Hypertension   . Thyroid disease    Past Surgical History:  Procedure Laterality Date  . APPENDECTOMY    . TUBAL LIGATION     Allergies  Allergen Reactions  . Biaxin [Clarithromycin]     Sick on stomach   No current facility-administered medications on file prior to encounter.    Current Outpatient Medications on File Prior to Encounter  Medication Sig Dispense Refill  . albuterol (PROVENTIL HFA;VENTOLIN HFA) 108 (90 Base) MCG/ACT inhaler Inhale 1-2 puffs into the lungs every 6 (six) hours as needed for wheezing or shortness of breath. 1 Inhaler 0  .  fluticasone (FLONASE) 50 MCG/ACT nasal spray Place 2 sprays into both nostrils daily.    . IRON PO Take 1 tablet by mouth daily.    Marland Kitchen loratadine (CLARITIN) 10 MG tablet Take 10 mg by mouth daily.    . [DISCONTINUED] ferrous sulfate 325 (65 FE) MG tablet Take 1 tablet (325 mg total) by mouth daily. 30 tablet 0   Social History   Socioeconomic History  . Marital status: Married    Spouse name: Not on file  . Number of children: Not on file  . Years of education: 63  . Highest education level: Not on file  Occupational History  . Not on file  Social Needs  . Financial resource strain: Not on file  . Food insecurity    Worry: Not on file    Inability: Not on file  . Transportation needs    Medical: Not on file    Non-medical: Not on file  Tobacco Use  . Smoking status: Current Every Day Smoker    Packs/day: 0.25    Years: 20.00    Pack years: 5.00    Types: Cigarettes  . Smokeless tobacco: Never Used  Substance and Sexual Activity  . Alcohol use: Yes    Comment: occasionally drinks mixed drink  . Drug use: No  . Sexual activity: Not on file  Lifestyle  . Physical activity    Days per week: Not on file    Minutes per session: Not on file  .  Stress: Not on file  Relationships  . Social Musicianconnections    Talks on phone: Not on file    Gets together: Not on file    Attends religious service: Not on file    Active member of club or organization: Not on file    Attends meetings of clubs or organizations: Not on file    Relationship status: Not on file  . Intimate partner violence    Fear of current or ex partner: Not on file    Emotionally abused: Not on file    Physically abused: Not on file    Forced sexual activity: Not on file  Other Topics Concern  . Not on file  Social History Narrative  . Not on file   Family History  Problem Relation Age of Onset  . Stroke Mother   . Hypertension Mother   . COPD Mother   . Cancer Father        prostate cancer     OBJECTIVE:  Vitals:   01/08/19 1623  BP: (!) 177/113  Pulse: 90  Resp: 20  Temp: (!) 97.3 F (36.3 C)  TempSrc: Oral     General appearance: alert; well-appearing, nontoxic HEENT: NCAT; Ears: LT EAC clear, LT TM pearly gray with visible cone of light, without erythema, RT EAC clear, RT TM erythematous, no mastoid erythema Eyes: PERRL, EOMI grossly; Nose: patent without rhinorrhea; Throat: oropharynx clear, tonsils not erythematous or enlarged, uvula absent Neck: supple without LAD Lungs: unlabored respirations, symmetrical air entry; cough: absent; no respiratory distress Heart: regular rate and rhythm.  Radial pulses 2+ symmetrical bilaterally Skin: warm and dry Psychological: alert and cooperative; normal mood and affect  ASSESSMENT & PLAN:  1. Non-recurrent acute suppurative otitis media of right ear without spontaneous rupture of tympanic membrane   2. Essential hypertension   3. Medication refill    Meds ordered this encounter  Medications  . amoxicillin-clavulanate (AUGMENTIN) 875-125 MG tablet    Sig: Take 1 tablet by mouth every 12 (twelve) hours for 10 days.    Dispense:  20 tablet    Refill:  0    Order Specific Question:   Supervising Provider    Answer:   Eustace MooreNELSON, YVONNE SUE [[id number]]  . omeprazole (PRILOSEC) 40 MG capsule    Sig: Take 1 capsule (40 mg total) by mouth daily.    Dispense:  30 capsule    Refill:  1    Order Specific Question:   Supervising Provider    Answer:   Eustace MooreNELSON, YVONNE SUE [[id number]]  . lisinopril (ZESTRIL) 10 MG tablet    Sig: Take 1 tablet (10 mg total) by mouth daily.    Dispense:  30 tablet    Refill:  1    Order Specific Question:   Supervising Provider    Answer:   Eustace MooreELSON, YVONNE SUE [[id number]]   Rest and drink plenty of fluids Prescribed augmentin 875 mg twice daily for 10 days Ibuprofen 800 mg prescribed.  Take as instructed for pain. Avoid taking with other antiinflammatories as this may cause GI upset and/or bleed Follow up  with PCP as needed.  A list of local PCPs in the area is attached Return here or go to the ER if you have any new or worsening symptoms fever, chills, nausea, vomiting, worsening symptoms despite medications, etc...  Please continue to monitor blood pressure at home and keep a log Eat a well balanced diet of fruits, vegetables and lean meats.  Avoid foods high in  fat and salt Drink water.  At least half your body weight in ounces Exercise for at least 30 minutes daily Follow up with PCP for further evaluation and management of high blood pressure Return or go to the ED if you have any new or worsening symptoms such as vision changes, fatigue, dizziness, chest pain, shortness of breath, nausea, swelling in your hands or feet, urinary symptoms, etc...  Omeprazole refilled as well  Reviewed expectations re: course of current medical issues. Questions answered. Outlined signs and symptoms indicating need for more acute intervention. Patient verbalized understanding. After Visit Summary given.         Rennis HardingWurst, Archer Vise, PA-C 01/08/19 1701

## 2019-01-08 NOTE — ED Triage Notes (Signed)
Pt has ear pain and fullness in right ear x 1 week , also needs med refill for BP medication

## 2019-12-01 ENCOUNTER — Emergency Department (HOSPITAL_COMMUNITY)
Admission: EM | Admit: 2019-12-01 | Discharge: 2019-12-01 | Disposition: A | Discharge status: Home / Self Care | Payer: 59 | Attending: Emergency Medicine | Admitting: Emergency Medicine

## 2019-12-01 ENCOUNTER — Encounter (HOSPITAL_COMMUNITY): Payer: Self-pay | Admitting: Emergency Medicine

## 2019-12-01 ENCOUNTER — Emergency Department (HOSPITAL_COMMUNITY): Payer: 59

## 2019-12-01 ENCOUNTER — Other Ambulatory Visit: Payer: Self-pay

## 2019-12-01 DIAGNOSIS — D649 Anemia, unspecified: Secondary | ICD-10-CM | POA: Insufficient documentation

## 2019-12-01 DIAGNOSIS — R0600 Dyspnea, unspecified: Secondary | ICD-10-CM | POA: Diagnosis not present

## 2019-12-01 DIAGNOSIS — K219 Gastro-esophageal reflux disease without esophagitis: Secondary | ICD-10-CM | POA: Diagnosis not present

## 2019-12-01 DIAGNOSIS — R0789 Other chest pain: Secondary | ICD-10-CM | POA: Insufficient documentation

## 2019-12-01 DIAGNOSIS — R1011 Right upper quadrant pain: Secondary | ICD-10-CM | POA: Diagnosis not present

## 2019-12-01 DIAGNOSIS — I10 Essential (primary) hypertension: Secondary | ICD-10-CM | POA: Diagnosis not present

## 2019-12-01 DIAGNOSIS — Z79899 Other long term (current) drug therapy: Secondary | ICD-10-CM | POA: Diagnosis not present

## 2019-12-01 DIAGNOSIS — R079 Chest pain, unspecified: Secondary | ICD-10-CM | POA: Diagnosis present

## 2019-12-01 DIAGNOSIS — R110 Nausea: Secondary | ICD-10-CM | POA: Diagnosis not present

## 2019-12-01 DIAGNOSIS — I959 Hypotension, unspecified: Secondary | ICD-10-CM

## 2019-12-01 DIAGNOSIS — R10811 Right upper quadrant abdominal tenderness: Secondary | ICD-10-CM

## 2019-12-01 DIAGNOSIS — F17210 Nicotine dependence, cigarettes, uncomplicated: Secondary | ICD-10-CM | POA: Insufficient documentation

## 2019-12-01 LAB — COMPREHENSIVE METABOLIC PANEL
ALT: 13 U/L (ref 0–44)
AST: 16 U/L (ref 15–41)
Albumin: 3.8 g/dL (ref 3.5–5.0)
Alkaline Phosphatase: 84 U/L (ref 38–126)
Anion gap: 10 (ref 5–15)
BUN: 15 mg/dL (ref 6–20)
CO2: 27 mmol/L (ref 22–32)
Calcium: 9.1 mg/dL (ref 8.9–10.3)
Chloride: 100 mmol/L (ref 98–111)
Creatinine, Ser: 0.64 mg/dL (ref 0.44–1.00)
GFR calc Af Amer: >60 mL/min (ref >60)
GFR calc non Af Amer: >60 mL/min (ref >60)
Glucose, Bld: 100 mg/dL — ABNORMAL HIGH (ref 70–99)
Potassium: 3.9 mmol/L (ref 3.5–5.1)
Sodium: 137 mmol/L (ref 135–145)
Total Bilirubin: 0.3 mg/dL (ref 0.3–1.2)
Total Protein: 7.8 g/dL (ref 6.5–8.1)

## 2019-12-01 LAB — CBC WITH DIFFERENTIAL/PLATELET
Abs Immature Granulocytes: 0.02 10*3/uL (ref 0.00–0.07)
Basophils Absolute: 0.1 10*3/uL (ref 0.0–0.1)
Basophils Relative: 1 %
Eosinophils Absolute: 0.1 10*3/uL (ref 0.0–0.5)
Eosinophils Relative: 1 %
HCT: 34.3 % — ABNORMAL LOW (ref 36.0–46.0)
Hemoglobin: 9.5 g/dL — ABNORMAL LOW (ref 12.0–15.0)
Immature Granulocytes: 0 %
Lymphocytes Relative: 30 %
Lymphs Abs: 1.8 10*3/uL (ref 0.7–4.0)
MCH: 22.9 pg — ABNORMAL LOW (ref 26.0–34.0)
MCHC: 27.7 g/dL — ABNORMAL LOW (ref 30.0–36.0)
MCV: 82.9 fL (ref 80.0–100.0)
Monocytes Absolute: 0.6 10*3/uL (ref 0.1–1.0)
Monocytes Relative: 10 %
Neutro Abs: 3.4 10*3/uL (ref 1.7–7.7)
Neutrophils Relative %: 58 %
Platelets: 318 10*3/uL (ref 150–400)
RBC: 4.14 MIL/uL (ref 3.87–5.11)
RDW: 19.1 % — ABNORMAL HIGH (ref 11.5–15.5)
WBC: 5.9 10*3/uL (ref 4.0–10.5)
nRBC: 0 % (ref 0.0–0.2)

## 2019-12-01 LAB — LIPASE, BLOOD: Lipase: 24 U/L (ref 11–51)

## 2019-12-01 LAB — D-DIMER, QUANTITATIVE: D-Dimer, Quant: 0.36 ug/mL-FEU (ref 0.00–0.50)

## 2019-12-01 LAB — TROPONIN I (HIGH SENSITIVITY)
Troponin I (High Sensitivity): 2 ng/L (ref <18)
Troponin I (High Sensitivity): 3 ng/L (ref <18)

## 2019-12-01 MED ORDER — LISINOPRIL 10 MG PO TABS
10.0000 mg | ORAL_TABLET | Freq: Every day | ORAL | 0 refills | Status: DC
Start: 2019-12-01 — End: 2022-12-09

## 2019-12-01 MED ORDER — PANTOPRAZOLE SODIUM 20 MG PO TBEC
20.0000 mg | DELAYED_RELEASE_TABLET | Freq: Two times a day (BID) | ORAL | 1 refills | Status: DC
Start: 2019-12-01 — End: 2020-01-18

## 2019-12-01 MED ORDER — SODIUM CHLORIDE 0.9 % IV BOLUS
1000.0000 mL | Freq: Once | INTRAVENOUS | Status: AC
  Administered 2019-12-01: 1000 mL via INTRAVENOUS

## 2019-12-01 MED ORDER — LISINOPRIL 10 MG PO TABS
10.0000 mg | ORAL_TABLET | Freq: Once | ORAL | Status: AC
  Administered 2019-12-01: 10 mg via ORAL
  Filled 2019-12-01: qty 1

## 2019-12-01 MED ORDER — PANTOPRAZOLE SODIUM 40 MG PO TBEC
40.0000 mg | DELAYED_RELEASE_TABLET | Freq: Once | ORAL | Status: AC
  Administered 2019-12-01: 40 mg via ORAL
  Filled 2019-12-01: qty 1

## 2019-12-01 MED ORDER — LIDOCAINE VISCOUS HCL 2 % MT SOLN
15.0000 mL | Freq: Once | OROMUCOSAL | Status: AC
  Administered 2019-12-01: 15 mL via ORAL
  Filled 2019-12-01: qty 15

## 2019-12-01 MED ORDER — ALUM & MAG HYDROXIDE-SIMETH 200-200-20 MG/5ML PO SUSP
30.0000 mL | Freq: Once | ORAL | Status: AC
  Administered 2019-12-01: 30 mL via ORAL
  Filled 2019-12-01: qty 30

## 2019-12-01 MED ORDER — PROCHLORPERAZINE EDISYLATE 10 MG/2ML IJ SOLN
10.0000 mg | Freq: Once | INTRAMUSCULAR | Status: AC
  Administered 2019-12-01: 10 mg via INTRAVENOUS
  Filled 2019-12-01: qty 2

## 2019-12-01 MED ORDER — ONDANSETRON HCL 4 MG/2ML IJ SOLN
4.0000 mg | Freq: Once | INTRAMUSCULAR | Status: AC
  Administered 2019-12-01: 4 mg via INTRAVENOUS
  Filled 2019-12-01: qty 2

## 2019-12-01 MED ORDER — ASPIRIN 81 MG PO CHEW
324.0000 mg | CHEWABLE_TABLET | Freq: Once | ORAL | Status: AC
  Administered 2019-12-01: 324 mg via ORAL
  Filled 2019-12-01: qty 4

## 2019-12-01 NOTE — ED Provider Notes (Signed)
Patient's ultrasound without any acute findings.  However patient had period of hypotension.  Her blood pressures were below ninety.  Felt it may have been due to volume patient received 2 L of fluid.  Also during the night patient was given her blood pressure medicine.  At that time her systolic pressures were in the 140s.  After the 2 L of fluid blood pressure came back up to the 120s and has been stable.  Will discharge patient home follow-up with gastroenterology and also taking Protonix.  Feel that symptoms are predominantly secondary to bad reflux disease.   Vanetta Mulders, MD 12/01/19 1421

## 2019-12-01 NOTE — ED Triage Notes (Signed)
Pt here with chest pain for over the last week. Pt describes it as a dullness across her chest.

## 2019-12-01 NOTE — ED Notes (Signed)
Pt reports she feels better  Reports that she was given hypertensive meds when she arrived   BP has been recorded with pt on L side sleeping   Dr Herma Carson in to assess and VO given for NS

## 2019-12-01 NOTE — Discharge Instructions (Addendum)
Start taking the Protonix make an appointment to follow-up with Dr. Dionicia Abler from gastroenterology.  Today's work-up without any acute findings.  The low blood pressure which is probably secondary to dehydration and your blood pressure meds has corrected itself.  Return for any new or worse symptoms.
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
## 2020-01-18 ENCOUNTER — Encounter (INDEPENDENT_AMBULATORY_CARE_PROVIDER_SITE_OTHER): Payer: Self-pay | Admitting: Gastroenterology

## 2020-01-18 ENCOUNTER — Ambulatory Visit (INDEPENDENT_AMBULATORY_CARE_PROVIDER_SITE_OTHER): Payer: 59 | Admitting: Gastroenterology

## 2020-01-18 ENCOUNTER — Encounter (INDEPENDENT_AMBULATORY_CARE_PROVIDER_SITE_OTHER): Payer: Self-pay | Admitting: *Deleted

## 2020-01-18 ENCOUNTER — Other Ambulatory Visit: Payer: Self-pay

## 2020-01-18 DIAGNOSIS — R1013 Epigastric pain: Secondary | ICD-10-CM | POA: Diagnosis not present

## 2020-01-18 MED ORDER — OMEPRAZOLE 40 MG PO CPDR: 40.0000 mg | DELAYED_RELEASE_CAPSULE | Freq: Two times a day (BID) | ORAL | 1 refills | Status: DC

## 2020-01-18 NOTE — Patient Instructions (Signed)
-   Continue omeprazole 40 mg twice a day  - Avoid heavy meals at night, eat earlier - Schedule EGD with gastric biopsies

## 2020-01-18 NOTE — Progress Notes (Signed)
[name], M.D. Gastroenterology & Hepatology [facility] [address] [place], [place] [address]  Referring MD: Kristina Booze MD  I will communicate my assessment and recommendations to the referring MD via EMR. "Note: Occasional unusual wording and randomly placed punctuation marks may result from the use of speech recognition technology to transcribe this document"  Chief Complaint:  Epigastric abdominal pain  History of Present Illness: [name] is a 49 y.o. female with PMH anxiety, HTN, hypothyroidism, who presents for evaluation of epigastric abdominal pain.  Patient reports that she has had a long-standing history of epigastric burning sensation, for at least the last 25 years. The pain has been getting worse for the last year. She has been taking omeprazole for the last 8 years, usually taking the OTC presentation 2 tablets ; however she reports that she has been taking 4-5 tablets every day for the last 5 months as lower doses did not help relieve the pain but most recently t doses of 100 mg/day have not been helpful. She has presented some burning sensation that wakes her up if she has eaten late.   She reports that for the last 6-8 weeks she feels any type of food causes her epigastric pain to flare, so she has been eating ice cream as it is the only food she has been able to tolerate. She avoids ketchup or pepper as this specifically causes worsening symptoms. She feels nauseated occasionally but no vomiting. Sometimes she feels bloated. She has occasional burping that improves with Tums. She used to have a BM every 2-3 weeks but now she is having 2-3 BMs per week. The patient denies having any fever, chills, hematochezia, melena, hematemesis, diarrhea, jaundice, pruritus, dysphagia, odynophagia or weight loss (has been gaining weight with her diet actually). She takes ibuprofen maybe once a month her headache, but she notices  it worsens her abdominal pain.  She has been under severe stress due to the recent demise of her father and financial issues, which she believes has potentially increase the frequency of her symptoms.  Last EGD: at least 20+ years ago, no report available but states "they streched a muscle with a dilator" Last Colonoscopy: never  FHx: neg for any gastrointestinal/liver disease, prostate cancer Social: smokes to <1 pack per day - has smoked for at least 30 years, neg alcohol or illicit drug use  Past Medical History: Past Medical History:  Diagnosis Date  . Anemia   . Anxiety   . Back pain, chronic   . Depression   . GERD (gastroesophageal reflux disease)   . Hypertension   . Thyroid disease     Past Surgical History: Past Surgical History:  Procedure Laterality Date  . APPENDECTOMY    . TUBAL LIGATION      Family History: Family History  Problem Relation Age of Onset  . Stroke Mother   . Hypertension Mother   . COPD Mother   . Cancer Father        prostate cancer    Social History: Social History   Tobacco Use  Smoking Status Current Every Day Smoker  . Packs/day: 0.25  . Years: 20.00  . Pack years: 5.00  . Types: Cigarettes  Smokeless Tobacco Never Used   Social History   Substance and Sexual Activity  Alcohol Use Yes   Comment: occasionally drinks mixed drink   Social History   Substance and Sexual Activity  Drug Use No    Allergies: Allergies  Allergen Reactions  . Biaxin [Clarithromycin]     Sick on stomach    Medications: Current Outpatient Medications  Medication Sig Dispense Refill  . fluticasone (FLONASE) 50 MCG/ACT nasal spray Place 2 sprays into both nostrils daily.    Marland Kitchen loratadine (CLARITIN) 10 MG tablet Take 10 mg by mouth daily.    Marland Kitchen omeprazole (PRILOSEC) 40 MG capsule Take 1 capsule (40 mg total) by mouth daily. 30 capsule 1  . albuterol (PROVENTIL HFA;VENTOLIN HFA) 108 (90 Base) MCG/ACT inhaler Inhale 1-2 puffs into the lungs  every 6 (six) hours as needed for wheezing or shortness of breath. (Patient not taking: Reported on 12/01/2019) 1 Inhaler 0  . ibuprofen (ADVIL) 800 MG tablet Take 1 tablet (800 mg total) by mouth 2 (two) times daily as needed. (Patient not taking: Reported on 01/18/2020) 20 tablet 0  . lisinopril (ZESTRIL) 10 MG tablet Take 1 tablet (10 mg total) by mouth daily. (Patient not taking: Reported on 01/18/2020) 30 tablet 0  . pantoprazole (PROTONIX) 20 MG tablet Take 1 tablet (20 mg total) by mouth 2 (two) times daily before a meal. (Patient not taking: Reported on 01/18/2020) 28 tablet 1   No current facility-administered medications for this visit.    Review of Systems: GENERAL: negative for malaise, significant weight loss, night sweats and fever HEENT: No changes in hearing or vision, no nose bleeds or other nasal problems. No trouble swallowing NECK: Negative for lumps, goiter, pain and significant neck swelling RESPIRATORY: Negative for cough, wheezing and shortness of breath CARDIOVASCULAR: Negative for chest pain, leg swelling, palpitations, orthopnea GI: SEE HPI MUSCULOSKELETAL: Negative for joint pain or swelling, back pain, and muscle pain. SKIN: Negative for lesions, rash, and itching. PSYCH: Negative for sleep disturbance, mood disorder and recent psychosocial stressors. HEMATOLOGY Negative for prolonged bleeding, bruising easily, and swollen nodes. ENDOCRINE: Negative for cold or heat intolerance, polyuria, polydipsia and goiter. NEURO: negative for lightheadedness, dizziness, tremor, gait imbalance, syncope and seizures. The remainder of the review of systems is noncontributory.   Physical Exam: BP (!) 151/97 (BP Location: Right Arm, Patient Position: Sitting)   Pulse 81   Temp 98.8 F (37.1 C) (Oral)   Ht 5\' 1"  (1.549 m)   Wt 209 lb 12.8 oz (95.2 kg)   LMP 01/07/2020   BMI 39.64 kg/m  GENERAL: The patient is AO x3, in no acute distress.  Obese. HEENT: Head is normocephalic and  atraumatic. EOMI are intact. Mouth is well hydrated and without lesions. NECK: Supple. No masses LUNGS: Clear to auscultation. No presence of rhonchi/wheezing/rales. Adequate chest expansion HEART: RRR, normal s1 and s2. ABDOMEN: Tender upon palpation of the upper abdomen, worse on the epigastric area, no guarding, no peritoneal signs, and nondistended. BS +. No masses. EXTREMITIES: Without any cyanosis, clubbing, rash, lesions or edema. NEUROLOGIC: AOx3, no focal motor deficit. SKIN: no jaundice, no rashes   Imaging/Labs: as above  I personally reviewed and interpreted the available labs, imaging and endoscopic files.  Impression and Plan: Teyla Skidgel is a 49 y.o. female with PMH anxiety, HTN, hypothyroidism, who presents for evaluation of epigastric abdominal pain.  The patient has had chronic symptoms of epigastric abdominal pain which have been attributed to GERD in the past but she has not responded to high-dose PPI.  I explained to her that this is unusual for GERD, while her presentation is atypical for this disease as well.  I advised her that if she found helpful to take the PPI, she could take it  at a dose of 40 mg twice a day for now but not at higher doses of is not medically indicated.  We will need to investigate epigastric abdominal pain with an EGD with gastric biopsies to rule out H. pylori.  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  The patient understood and agreed.  More than 50% of the office visit was dedicated to discussing the procedure, including the day of and risks involved. Patient understands what the procedure involves including the benefits and any risks. Patient understands alternatives to the proposed procedure. Risks including (but not limited to) bleeding, tearing of the lining (perforation),  rupture of adjacent organs, problems with heart and lung function, infection, and medication reactions. A small percentage of complications may require surgery, hospitalization, repeat endoscopic procedure, and/or transfusion.  - Continue omeprazole 40 mg twice a day  - Avoid heavy meals at night, eat earlier - Schedule EGD with gastric biopsies - RTC 6 weeks  All questions were answered.      Dolores Frame, MD

## 2020-01-19 IMAGING — DX DG CHEST 2V
2 series · 2 of 2 positions shown · non-contrast
Comparison: 03/10/2004

CLINICAL DATA: Shortness of breath, chest pain

EXAM:
CHEST - 2 VIEW

[chest pa]
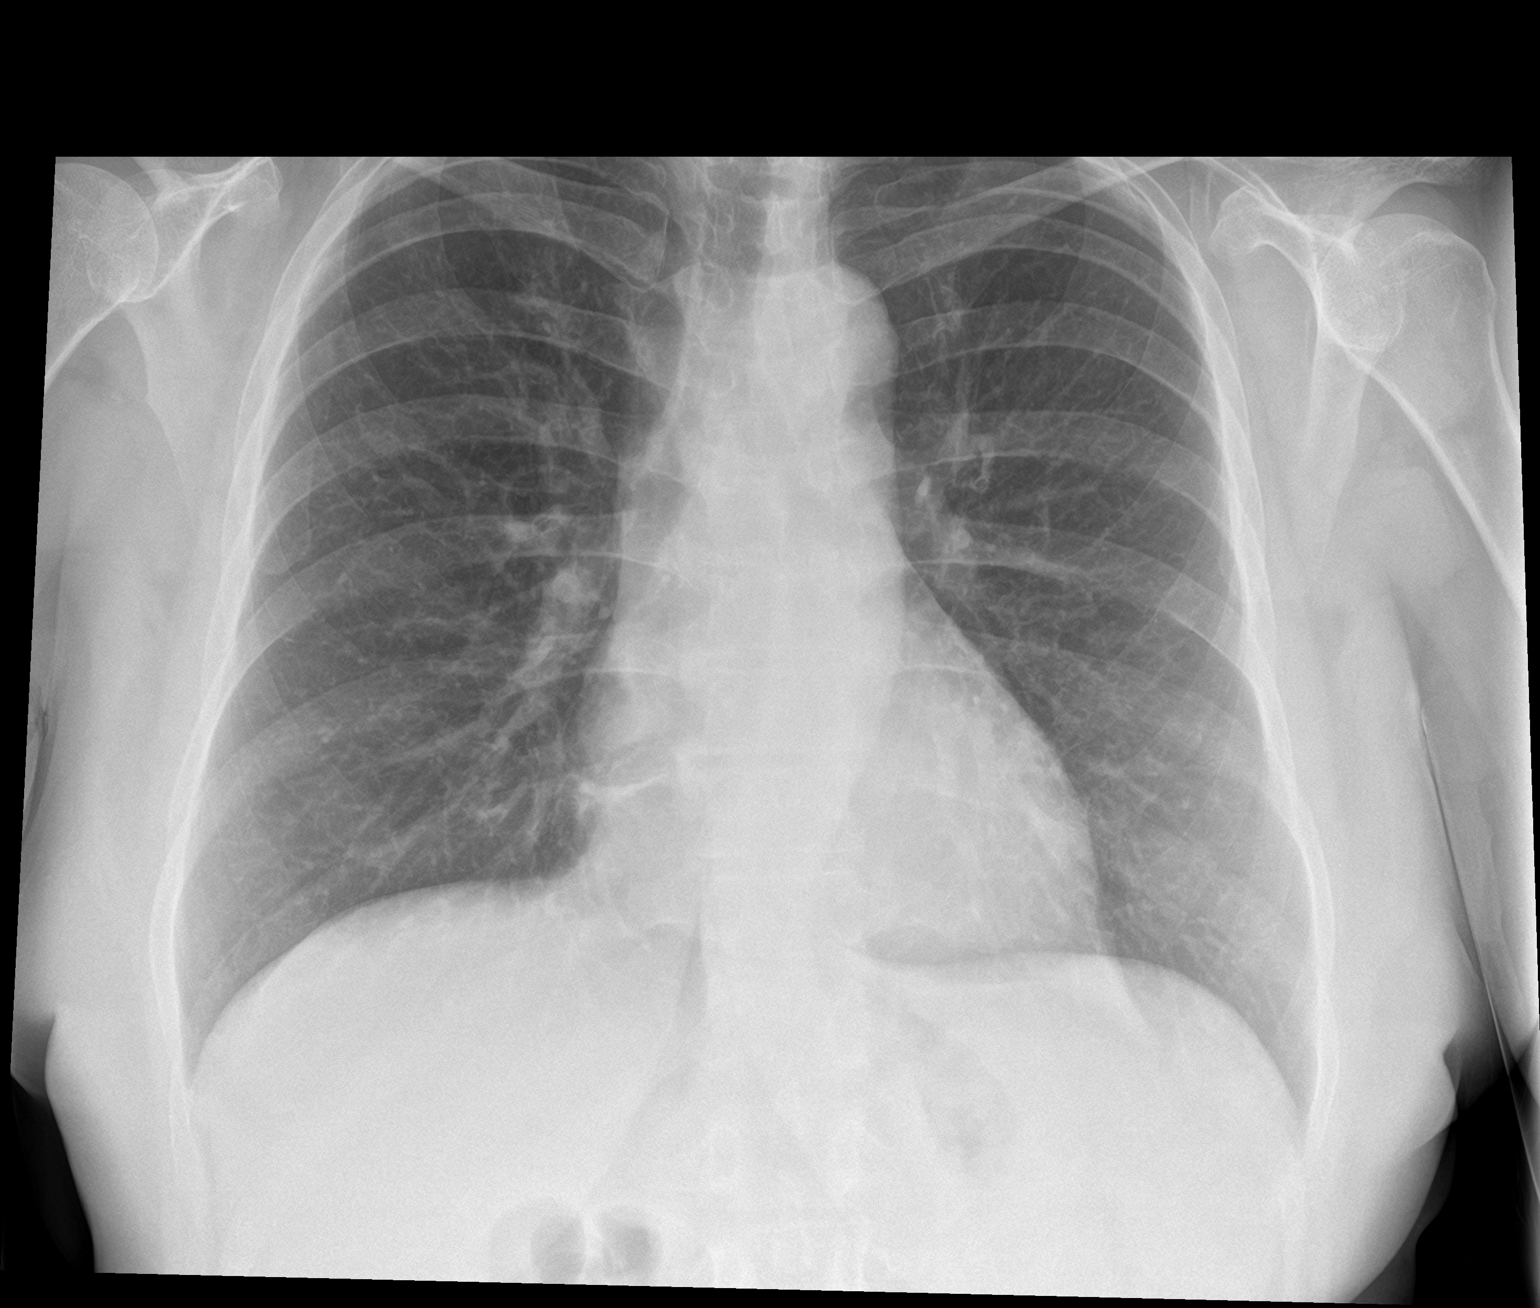

[chest lat]
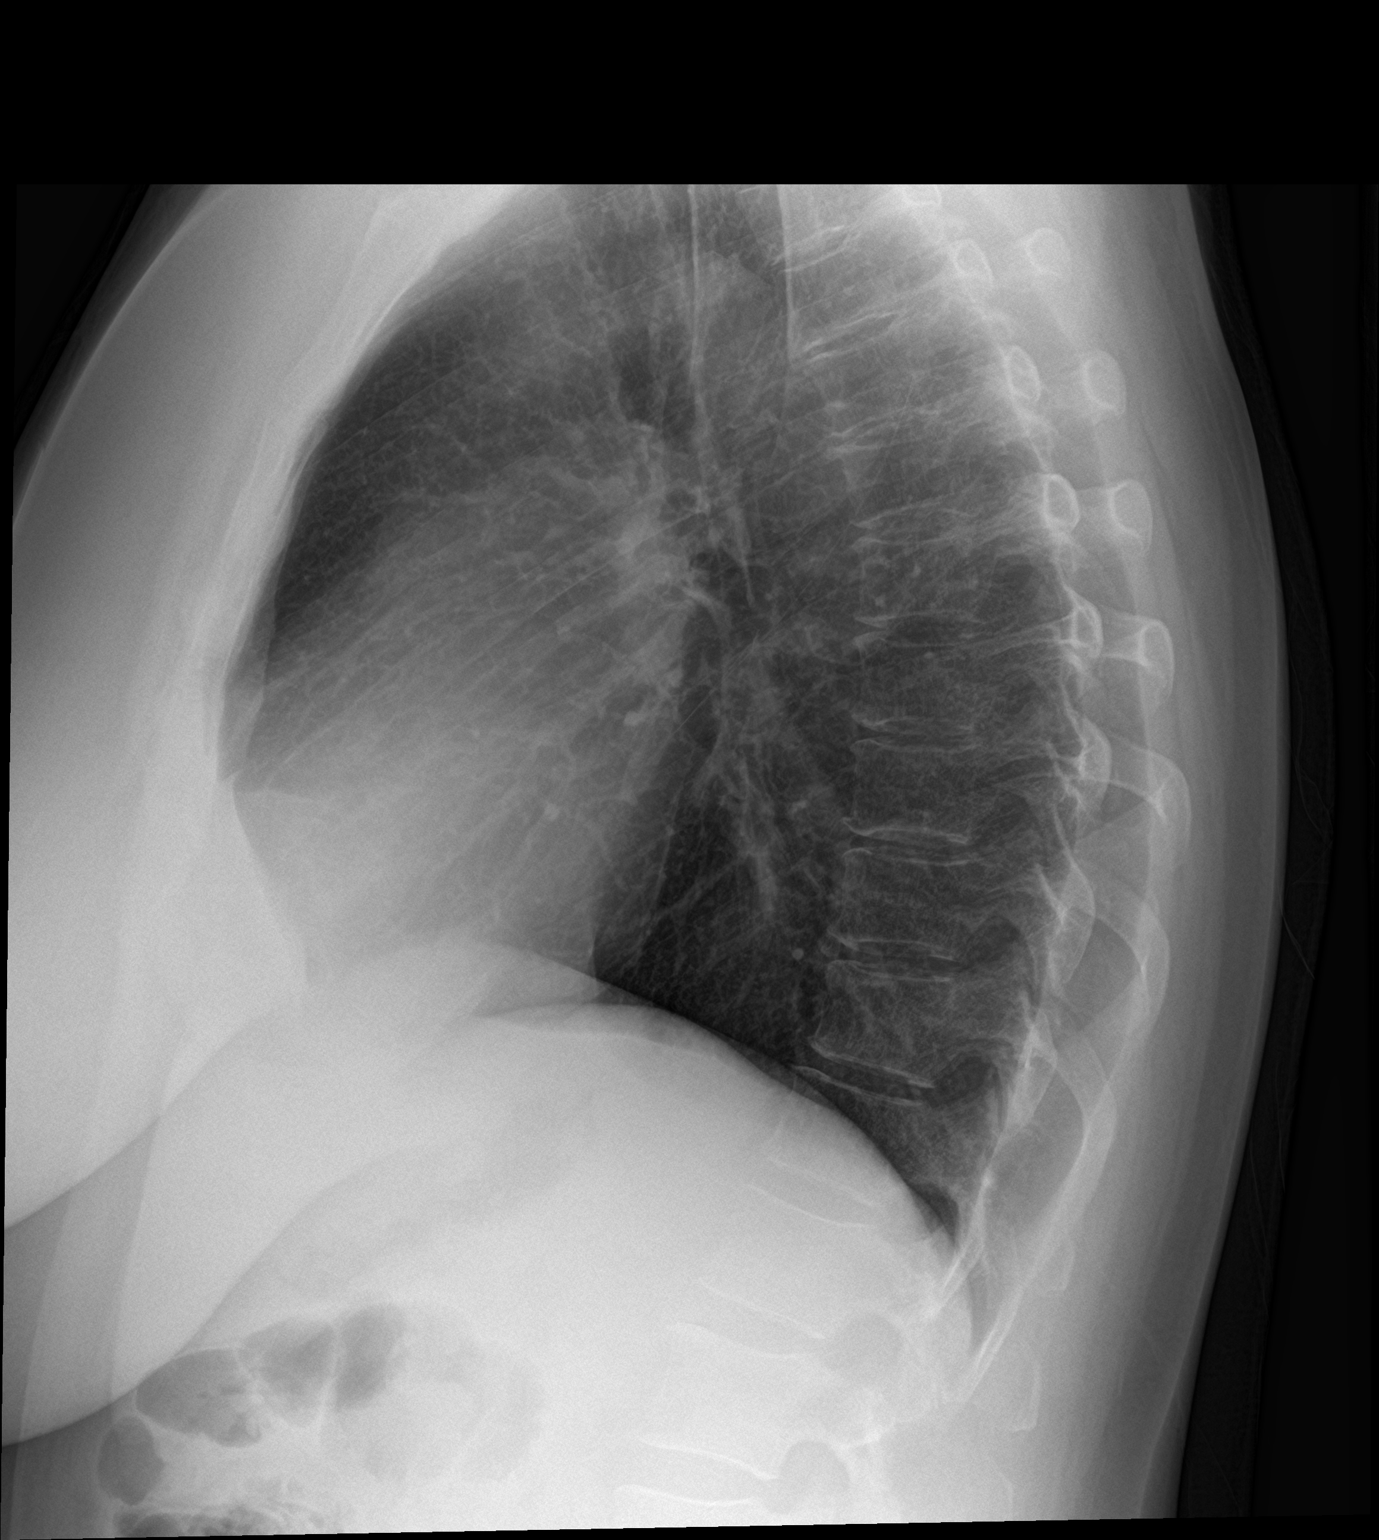

[2 of 2 positions shown; findings below may reference images not displayed]

FINDINGS: Heart and mediastinal contours are within normal limits. No focal
opacities or effusions. No acute bony abnormality.
IMPRESSION: No active cardiopulmonary disease.

## 2020-01-24 ENCOUNTER — Encounter (HOSPITAL_COMMUNITY)
Admission: RE | Admit: 2020-01-24 | Discharge: 2020-01-24 | Disposition: A | Discharge status: Home / Self Care | Payer: 59 | Source: Ambulatory Visit | Attending: Gastroenterology | Admitting: Gastroenterology

## 2020-01-24 ENCOUNTER — Other Ambulatory Visit: Payer: Self-pay

## 2020-01-28 ENCOUNTER — Other Ambulatory Visit (INDEPENDENT_AMBULATORY_CARE_PROVIDER_SITE_OTHER): Payer: Self-pay | Admitting: *Deleted

## 2020-02-04 ENCOUNTER — Encounter (HOSPITAL_COMMUNITY): Payer: Self-pay

## 2020-02-04 ENCOUNTER — Other Ambulatory Visit: Payer: Self-pay

## 2020-02-04 ENCOUNTER — Other Ambulatory Visit (HOSPITAL_COMMUNITY)
Admission: RE | Admit: 2020-02-04 | Discharge: 2020-02-04 | Disposition: A | Discharge status: Home / Self Care | Payer: 59 | Source: Ambulatory Visit | Attending: Gastroenterology | Admitting: Gastroenterology

## 2020-02-04 DIAGNOSIS — Z20822 Contact with and (suspected) exposure to covid-19: Secondary | ICD-10-CM | POA: Insufficient documentation

## 2020-02-04 DIAGNOSIS — Z01812 Encounter for preprocedural laboratory examination: Secondary | ICD-10-CM | POA: Insufficient documentation

## 2020-02-04 LAB — PREGNANCY, URINE: Preg Test, Ur: NEGATIVE

## 2020-02-04 LAB — SARS CORONAVIRUS 2 (TAT 6-24 HRS): SARS Coronavirus 2: NEGATIVE

## 2020-02-04 NOTE — Patient Instructions (Signed)
[name]  02/04/2020     @PREFPERIOPPHARMACY @   Your procedure is scheduled on  02/06/2020  Report to 02/08/2020 at  0845  A.M.  Call this number if you have problems the morning of surgery:  [phone]   Remember:  Follow the diet instructions given to you by Dr [phone] office                     Take these medicines the morning of surgery with A SIP OF WATER  Prilosec. Use your inhaler before you come. If you have a rescue inhaler, bring it with you.    Do not wear jewelry, make-up or nail polish.  Do not wear lotions, powders, or perfumes. Please wear deodorant and brush your teeth.  Do not shave 48 hours prior to surgery.  Men may shave face and neck.  Do not bring valuables to the hospital.  [facility] is not responsible for any belongings or valuables.  Contacts, dentures or bridgework may not be worn into surgery.  Leave your suitcase in the car.  After surgery it may be brought to your room.  For patients admitted to the hospital, discharge time will be determined by your treatment team.  Patients discharged the day of surgery will not be allowed to drive home.   Name and phone number of your driver:   Family. Special instructions:  DO NOT smoke the morning of your procedure.  Please read over the following fact sheets that you were given. Anesthesia Post-op Instructions and Care and Recovery After Surgery       Upper Endoscopy, Adult, Care After This sheet gives you information about how to care for yourself after your procedure. Your health care provider may also give you more specific instructions. If you have problems or questions, contact your health care provider. What can I expect after the procedure? After the procedure, it is common to have:  A sore throat.  Mild stomach pain or discomfort.  Bloating.  Nausea. Follow these instructions at home:   Follow instructions from your health care provider about what to eat or drink  after your procedure.  Return to your normal activities as told by your health care provider. Ask your health care provider what activities are safe for you.  Take over-the-counter and prescription medicines only as told by your health care provider.  Do not drive for 24 hours if you were given a sedative during your procedure.  Keep all follow-up visits as told by your health care provider. This is important. Contact a health care provider if you have:  A sore throat that lasts longer than one day.  Trouble swallowing. Get help right away if:  You vomit blood or your vomit looks like coffee grounds.  You have: ? A fever. ? Bloody, black, or tarry stools. ? A severe sore throat or you cannot swallow. ? Difficulty breathing. ? Severe pain in your chest or abdomen. Summary  After the procedure, it is common to have a sore throat, mild stomach discomfort, bloating, and nausea.  Do not drive for 24 hours if you were given a sedative during the procedure.  Follow instructions from your health care provider about what to eat or drink after your procedure.  Return to your normal activities as told by your health care provider. This information is not intended to replace advice given to you by your health care provider. Make sure you  discuss any questions you have with your health care provider. Document Revised: 12/20/2017 Document Reviewed: 11/28/2017 Elsevier Patient Education  2020 [employer] Monitored Anesthesia Care, Care After These instructions provide you with information about caring for yourself after your procedure. Your health care provider may also give you more specific instructions. Your treatment has been planned according to current medical practices, but problems sometimes occur. Call your health care provider if you have any problems or questions after your procedure. What can I expect after the procedure? After your procedure, you may:  Feel sleepy for  several hours.  Feel clumsy and have poor balance for several hours.  Feel forgetful about what happened after the procedure.  Have poor judgment for several hours.  Feel nauseous or vomit.  Have a sore throat if you had a breathing tube during the procedure. Follow these instructions at home: For at least 24 hours after the procedure:      Have a responsible adult stay with you. It is important to have someone help care for you until you are awake and alert.  Rest as needed.  Do not: ? Participate in activities in which you could fall or become injured. ? Drive. ? Use heavy machinery. ? Drink alcohol. ? Take sleeping pills or medicines that cause drowsiness. ? Make important decisions or sign legal documents. ? Take care of children on your own. Eating and drinking  Follow the diet that is recommended by your health care provider.  If you vomit, drink water, juice, or soup when you can drink without vomiting.  Make sure you have little or no nausea before eating solid foods. General instructions  Take over-the-counter and prescription medicines only as told by your health care provider.  If you have sleep apnea, surgery and certain medicines can increase your risk for breathing problems. Follow instructions from your health care provider about wearing your sleep device: ? Anytime you are sleeping, including during daytime naps. ? While taking prescription pain medicines, sleeping medicines, or medicines that make you drowsy.  If you smoke, do not smoke without supervision.  Keep all follow-up visits as told by your health care provider. This is important. Contact a health care provider if:  You keep feeling nauseous or you keep vomiting.  You feel light-headed.  You develop a rash.  You have a fever. Get help right away if:  You have trouble breathing. Summary  For several hours after your procedure, you may feel sleepy and have poor judgment.  Have a  responsible adult stay with you for at least 24 hours or until you are awake and alert. This information is not intended to replace advice given to you by your health care provider. Make sure you discuss any questions you have with your health care provider. Document Revised: 09/26/2017 Document Reviewed: 10/19/2015 Elsevier Patient Education  2020 [employer].

## 2020-02-06 ENCOUNTER — Other Ambulatory Visit: Payer: Self-pay

## 2020-02-06 ENCOUNTER — Ambulatory Visit (HOSPITAL_COMMUNITY): Payer: 59 | Admitting: Anesthesiology

## 2020-02-06 ENCOUNTER — Encounter (HOSPITAL_COMMUNITY): Payer: Self-pay | Admitting: Gastroenterology

## 2020-02-06 ENCOUNTER — Ambulatory Visit (HOSPITAL_COMMUNITY)
Admission: RE | Admit: 2020-02-06 | Discharge: 2020-02-06 | Disposition: A | Discharge status: Home / Self Care | Payer: 59 | Attending: Gastroenterology | Admitting: Gastroenterology

## 2020-02-06 ENCOUNTER — Encounter (HOSPITAL_COMMUNITY)
Admission: RE | Disposition: A | Discharge status: Home / Self Care | Payer: Self-pay | Source: Home / Self Care | Attending: Gastroenterology

## 2020-02-06 DIAGNOSIS — K2950 Unspecified chronic gastritis without bleeding: Secondary | ICD-10-CM | POA: Diagnosis not present

## 2020-02-06 DIAGNOSIS — K319 Disease of stomach and duodenum, unspecified: Secondary | ICD-10-CM | POA: Diagnosis not present

## 2020-02-06 DIAGNOSIS — I10 Essential (primary) hypertension: Secondary | ICD-10-CM | POA: Diagnosis not present

## 2020-02-06 DIAGNOSIS — F17210 Nicotine dependence, cigarettes, uncomplicated: Secondary | ICD-10-CM | POA: Diagnosis not present

## 2020-02-06 DIAGNOSIS — K219 Gastro-esophageal reflux disease without esophagitis: Secondary | ICD-10-CM | POA: Insufficient documentation

## 2020-02-06 DIAGNOSIS — R1013 Epigastric pain: Secondary | ICD-10-CM | POA: Insufficient documentation

## 2020-02-06 DIAGNOSIS — K228 Other specified diseases of esophagus: Secondary | ICD-10-CM | POA: Diagnosis not present

## 2020-02-06 DIAGNOSIS — Z79899 Other long term (current) drug therapy: Secondary | ICD-10-CM | POA: Insufficient documentation

## 2020-02-06 HISTORY — PX: ESOPHAGOGASTRODUODENOSCOPY (EGD) WITH PROPOFOL: SHX5813

## 2020-02-06 HISTORY — PX: BIOPSY: SHX5522

## 2020-02-06 SURGERY — ESOPHAGOGASTRODUODENOSCOPY (EGD) WITH PROPOFOL
Anesthesia: General

## 2020-02-06 MED ORDER — PROPOFOL 10 MG/ML IV BOLUS
INTRAVENOUS | Status: AC
  Filled 2020-02-06: qty 40

## 2020-02-06 MED ORDER — LIDOCAINE VISCOUS HCL 2 % MT SOLN
15.0000 mL | Freq: Once | OROMUCOSAL | Status: AC
  Administered 2020-02-06: 15 mL via OROMUCOSAL

## 2020-02-06 MED ORDER — LACTATED RINGERS IV SOLN: Freq: Once | INTRAVENOUS | Status: AC

## 2020-02-06 MED ORDER — PROPOFOL 500 MG/50ML IV EMUL
INTRAVENOUS | Status: DC | PRN
Start: 2020-02-06 — End: 2020-02-06
  Administered 2020-02-06: 150 ug/kg/min via INTRAVENOUS

## 2020-02-06 MED ORDER — LACTATED RINGERS IV SOLN: INTRAVENOUS | Status: DC | PRN

## 2020-02-06 MED ORDER — LIDOCAINE VISCOUS HCL 2 % MT SOLN
OROMUCOSAL | Status: AC
  Filled 2020-02-06: qty 15

## 2020-02-06 MED ORDER — GLYCOPYRROLATE 0.2 MG/ML IJ SOLN
0.2000 mg | Freq: Once | INTRAMUSCULAR | Status: AC
  Administered 2020-02-06: .2 mg via INTRAVENOUS

## 2020-02-06 NOTE — Anesthesia Postprocedure Evaluation (Signed)
Anesthesia Post Note  Patient: Rilya Longo  Procedure(s) Performed: ESOPHAGOGASTRODUODENOSCOPY (EGD) WITH PROPOFOL (N/A ) BIOPSY  Patient location during evaluation: Phase II Anesthesia Type: General Level of consciousness: awake, oriented, awake and alert and patient cooperative Pain management: satisfactory to patient Vital Signs Assessment: post-procedure vital signs reviewed and stable Respiratory status: spontaneous breathing, respiratory function stable and nonlabored ventilation Cardiovascular status: stable Postop Assessment: no apparent nausea or vomiting Anesthetic complications: no   No complications documented.   Last Vitals:  Vitals:   02/06/20 1118 02/06/20 1126  BP: 125/77 (!) 124/89  Pulse: 75 79  Resp: 13 20  Temp: 36.6 C   SpO2: 100% 100%    Last Pain:  Vitals:   02/06/20 1129  TempSrc:   PainSc: 0-No pain                 Ondria Oswald

## 2020-02-06 NOTE — Anesthesia Preprocedure Evaluation (Addendum)
Anesthesia Evaluation  Patient identified by MRN, date of birth, ID band Patient awake    Reviewed: Allergy & Precautions, NPO status , Patient's Chart, lab work & pertinent test results  History of Anesthesia Complications Negative for: history of anesthetic complications  Airway Mallampati: II  TM Distance: >3 FB Neck ROM: Full    Dental  (+) Teeth Intact, Dental Advisory Given   Pulmonary Current SmokerPatient did not abstain from smoking.,    Pulmonary exam normal breath sounds clear to auscultation       Cardiovascular Exercise Tolerance: Good hypertension, Pt. on medications Normal cardiovascular exam Rhythm:Regular Rate:Normal     Neuro/Psych PSYCHIATRIC DISORDERS Anxiety Depression negative neurological ROS     GI/Hepatic Neg liver ROS, GERD  Medicated and Controlled,  Endo/Other  Morbid obesity  Renal/GU negative Renal ROS     Musculoskeletal negative musculoskeletal ROS (+)   Abdominal   Peds  Hematology  (+) anemia ,   Anesthesia Other Findings   Reproductive/Obstetrics negative OB ROS                           Anesthesia Physical Anesthesia Plan  ASA: II  Anesthesia Plan: General   Post-op Pain Management:    Induction: Intravenous  PONV Risk Score and Plan: 1  Airway Management Planned: Nasal Cannula, Natural Airway and Simple Face Mask  Additional Equipment:   Intra-op Plan:   Post-operative Plan:   Informed Consent: I have reviewed the patients History and Physical, chart, labs and discussed the procedure including the risks, benefits and alternatives for the proposed anesthesia with the patient or authorized representative who has indicated his/her understanding and acceptance.     Dental advisory given  Plan Discussed with: CRNA and Surgeon  Anesthesia Plan Comments:        Anesthesia Quick Evaluation

## 2020-02-06 NOTE — H&P (Addendum)
[name] is an 49 y.o. female.   Chief Complaint: Abdominal pain HPI: Briefly, the patient is a 49 year old female with past medical history of anxiety, HTN, hypothyroidism, who presents for evaluation of chronic epigastric abdominal pain with an EGD.  Patient reports a 25-year history of epigastric abdominal pain which has not improved with intake of pantoprazole in the past at high doses.  She has also presented episodes of burping.  Her appetite change although she has been gaining weight, but she previously stated she was only able to tolerate ice cream.  She was recently started on omeprazole 40 mg twice daily which has helped her symptoms and now she is able to tolerate more meals than before.  Last EGD was performed at least 20+ years ago, no report available but states "they streched a muscle with a dilator".  Past Medical History:  Diagnosis Date  . Anemia   . Anxiety   . Back pain, chronic   . Depression   . GERD (gastroesophageal reflux disease)   . Hypertension   . Thyroid disease     Past Surgical History:  Procedure Laterality Date  . APPENDECTOMY    . TUBAL LIGATION      Family History  Problem Relation Age of Onset  . Stroke Mother   . Hypertension Mother   . COPD Mother   . Cancer Father        prostate cancer   Social History:  reports that she has been smoking cigarettes. She has a 5.00 pack-year smoking history. She has never used smokeless tobacco. She reports current alcohol use. She reports that she does not use drugs.  Allergies:  Allergies  Allergen Reactions  . Biaxin [Clarithromycin]     Sick on stomach    Medications Prior to Admission  Medication Sig Dispense Refill  . ibuprofen (ADVIL) 800 MG tablet Take 1 tablet (800 mg total) by mouth 2 (two) times daily as needed. 20 tablet 0  . loratadine (CLARITIN) 10 MG tablet Take 10 mg by mouth daily.    Marland Kitchen omeprazole (PRILOSEC) 40 MG capsule Take 1 capsule (40 mg total) by mouth in the morning and  at bedtime. 30 capsule 1  . albuterol (PROVENTIL HFA;VENTOLIN HFA) 108 (90 Base) MCG/ACT inhaler Inhale 1-2 puffs into the lungs every 6 (six) hours as needed for wheezing or shortness of breath. (Patient not taking: Reported on 12/01/2019) 1 Inhaler 0  . fluticasone (FLONASE) 50 MCG/ACT nasal spray Place 2 sprays into both nostrils daily.    Marland Kitchen lisinopril (ZESTRIL) 10 MG tablet Take 1 tablet (10 mg total) by mouth daily. (Patient not taking: Reported on 01/18/2020) 30 tablet 0    Results for orders placed or performed during the hospital encounter of 02/06/20 (from the past 48 hour(s))  Pregnancy, urine     Status: None   Collection Time: 02/04/20  1:18 PM  Result Value Ref Range   Preg Test, Ur NEGATIVE NEGATIVE    Comment:        THE SENSITIVITY OF THIS METHODOLOGY IS >20 mIU/mL. Performed at [facility], [address] [place], [place] [address]    No results found.  Review of Systems  Constitutional: Positive for appetite change.  HENT: Negative.   Eyes: Negative.   Respiratory: Negative.   Cardiovascular: Negative.   Gastrointestinal: Positive for abdominal pain.  Endocrine: Negative.   Genitourinary: Negative.   Musculoskeletal: Negative.   Skin: Negative.   Allergic/Immunologic: Negative.   Neurological: Negative.   Hematological:  Negative.   Psychiatric/Behavioral: Negative.     Blood pressure (!) 129/85, pulse 74, temperature 98.5 F (36.9 C), temperature source Oral, resp. rate (!) 10, height 5\' 1"  (1.549 m), last menstrual period 01/07/2020, SpO2 99 %. Physical Exam  GENERAL: The patient is AO x3, in no acute distress. Obese. HEENT: Head is normocephalic and atraumatic. EOMI are intact. Mouth is well hydrated and without lesions. NECK: Supple. No masses LUNGS: Clear to auscultation. No presence of rhonchi/wheezing/rales. Adequate chest expansion HEART: RRR, normal s1 and s2. ABDOMEN: Soft, nontender, no guarding, no peritoneal signs, and nondistended. BS +. No  masses. EXTREMITIES: Without any cyanosis, clubbing, rash, lesions or edema. NEUROLOGIC: AOx3, no focal motor deficit. SKIN: no jaundice, no rashes  Assessment/Plan 49 year old female with past medical history of anxiety, HTN, hypothyroidism, who presents for evaluation of chronic epigastric abdominal pain with an EGD.  The patient has had a longstanding history of epigastric abdominal pain which has not improved with intake of PPI in the past, after starting stronger PPI she has felt better and is tolerating more food.  We will investigate this abdominal pain further with an EGD with gastric biopsies today.  Patient understood and agreed.    58, MD 02/06/2020, 11:00 AM

## 2020-02-06 NOTE — Discharge Instructions (Signed)
Resume your previous diet.  We are waiting for your pathology results.  Continue your present medications.  Return to your GI clinic in two months.   *****Office will call or send a letter with date of follow up appointment.******      Upper Endoscopy, Adult, Care After This sheet gives you information about how to care for yourself after your procedure. Your health care provider may also give you more specific instructions. If you have problems or questions, contact your health care provider. What can I expect after the procedure? After the procedure, it is common to have:  A sore throat.  Mild stomach pain or discomfort.  Bloating.  Nausea. Follow these instructions at home:   Follow instructions from your health care provider about what to eat or drink after your procedure.  Return to your normal activities as told by your health care provider. Ask your health care provider what activities are safe for you.  Take over-the-counter and prescription medicines only as told by your health care provider.  Do not drive for 24 hours if you were given a sedative during your procedure.  Keep all follow-up visits as told by your health care provider. This is important. Contact a health care provider if you have:  A sore throat that lasts longer than one day.  Trouble swallowing. Get help right away if:  You vomit blood or your vomit looks like coffee grounds.  You have: ? A fever. ? Bloody, black, or tarry stools. ? A severe sore throat or you cannot swallow. ? Difficulty breathing. ? Severe pain in your chest or abdomen. Summary  After the procedure, it is common to have a sore throat, mild stomach discomfort, bloating, and nausea.  Do not drive for 24 hours if you were given a sedative during the procedure.  Follow instructions from your health care provider about what to eat or drink after your procedure.  Return to your normal activities as told by your health care  provider. This information is not intended to replace advice given to you by your health care provider. Make sure you discuss any questions you have with your health care provider. Document Revised: 12/20/2017 Document Reviewed: 11/28/2017 Elsevier Patient Education  2020 [employer]     Monitored Anesthesia Care, Care After These instructions provide you with information about caring for yourself after your procedure. Your health care provider may also give you more specific instructions. Your treatment has been planned according to current medical practices, but problems sometimes occur. Call your health care provider if you have any problems or questions after your procedure. What can I expect after the procedure? After your procedure, you may:  Feel sleepy for several hours.  Feel clumsy and have poor balance for several hours.  Feel forgetful about what happened after the procedure.  Have poor judgment for several hours.  Feel nauseous or vomit.  Have a sore throat if you had a breathing tube during the procedure. Follow these instructions at home: For at least 24 hours after the procedure:      Have a responsible adult stay with you. It is important to have someone help care for you until you are awake and alert.  Rest as needed.  Do not: ? Participate in activities in which you could fall or become injured. ? Drive. ? Use heavy machinery. ? Drink alcohol. ? Take sleeping pills or medicines that cause drowsiness. ? Make important decisions or sign legal documents. ? Take care of  children on your own. Eating and drinking  Follow the diet that is recommended by your health care provider.  If you vomit, drink water, juice, or soup when you can drink without vomiting.  Make sure you have little or no nausea before eating solid foods. General instructions  Take over-the-counter and prescription medicines only as told by your health care provider.  If you have  sleep apnea, surgery and certain medicines can increase your risk for breathing problems. Follow instructions from your health care provider about wearing your sleep device: ? Anytime you are sleeping, including during daytime naps. ? While taking prescription pain medicines, sleeping medicines, or medicines that make you drowsy.  If you smoke, do not smoke without supervision.  Keep all follow-up visits as told by your health care provider. This is important. Contact a health care provider if:  You keep feeling nauseous or you keep vomiting.  You feel light-headed.  You develop a rash.  You have a fever. Get help right away if:  You have trouble breathing. Summary  For several hours after your procedure, you may feel sleepy and have poor judgment.  Have a responsible adult stay with you for at least 24 hours or until you are awake and alert. This information is not intended to replace advice given to you by your health care provider. Make sure you discuss any questions you have with your health care provider. Document Revised: 09/26/2017 Document Reviewed: 10/19/2015 Elsevier Patient Education  [place].

## 2020-02-06 NOTE — Transfer of Care (Signed)
Immediate Anesthesia Transfer of Care Note  Patient: [name]  Procedure(s) Performed: ESOPHAGOGASTRODUODENOSCOPY (EGD) WITH PROPOFOL (N/A ) BIOPSY  Patient Location: PACU  Anesthesia Type:General  Level of Consciousness: awake, alert , oriented and patient cooperative  Airway & Oxygen Therapy: Patient Spontanous Breathing  Post-op Assessment: Report given to RN, Post -op Vital signs reviewed and stable and Patient moving all extremities X 4  Post vital signs: Reviewed and stable  Last Vitals:  Vitals Value Taken Time  BP 124/89 02/06/20 1126  Temp 36.6 C 02/06/20 1118  Pulse 79 02/06/20 1126  Resp 20 02/06/20 1126  SpO2 100 % 02/06/20 1126    Last Pain:  Vitals:   02/06/20 1129  TempSrc:   PainSc: 0-No pain      Patients Stated Pain Goal: 2 (02/06/20 0926)  Complications: No complications documented.

## 2020-02-06 NOTE — Op Note (Addendum)
[facility] Patient Name: [name] Procedure Date: 02/06/2020 10:36 AM MRN: [id number] Date of Birth: [birth year] Attending MD: [name] ,  CSN: [id number] Age: 49 Admit Type: Outpatient Procedure:                Upper GI endoscopy Indications:              Epigastric abdominal pain Providers:                [name], Criselda Peaches. Patsy Lager, RN, Dyann Ruddle Referring MD:              Medicines:                Monitored Anesthesia Care Complications:            No immediate complications. Estimated Blood Loss:     Estimated blood loss: none. Procedure:                Pre-Anesthesia Assessment:                           - Prior to the procedure, a History and Physical                            was performed, and patient medications, allergies                            and sensitivities were reviewed. The patient's                            tolerance of previous anesthesia was reviewed.                           - The risks and benefits of the procedure and the                            sedation options and risks were discussed with the                            patient. All questions were answered and informed                            consent was obtained.                           - ASA Grade Assessment: I - A normal, healthy                            patient.                           After obtaining informed consent, the endoscope was                            passed under direct vision. Throughout the  procedure, the patient's blood pressure, pulse, and                            oxygen saturations were monitored continuously. The                            GIF-H190 ([id number]) scope was introduced through the                            mouth, and advanced to the second part of duodenum.                            The upper GI endoscopy was accomplished without                            difficulty.  The patient tolerated the procedure                            well. Scope In: 11:08:57 AM Scope Out: 11:14:50 AM Total Procedure Duration: 0 hours 5 minutes 53 seconds  Findings:      The examined esophagus was normal.      The Z-line was irregular and was found 39 cm from the incisors.      The entire examined stomach was normal. Imaging was performed using zoom       magnification and narrow band imaging to visualize the mucosa. Biopsies       were taken with a cold forceps for Helicobacter pylori testing.      The examined duodenum was normal. Impression:               - Normal esophagus.                           - Z-line irregular, 39 cm from the incisors.                           - Normal stomach. Biopsied.                           - Normal examined duodenum. Moderate Sedation:      Per Anesthesia Care Recommendation:           - Discharge patient to home (ambulatory).                           - Resume previous diet.                           - Await pathology results.                           - Continue present medications.                           - Return to GI clinic in 2 months. Procedure Code(s):        --- Professional ---  07225, GC, Esophagogastroduodenoscopy, flexible,                            transoral; with biopsy, single or multiple Diagnosis Code(s):        --- Professional ---                           K22.8, Other specified diseases of esophagus                           R10.13, Epigastric pain CPT copyright 2019 American Medical Association. All rights reserved. The codes documented in this report are preliminary and upon coder review may  be revised to meet current compliance requirements. [name], MD [name],  02/06/2020 11:19:11 AM This report has been signed electronically. Number of Addenda: 0

## 2020-02-07 ENCOUNTER — Other Ambulatory Visit: Payer: Self-pay

## 2020-02-07 LAB — SURGICAL PATHOLOGY

## 2020-02-08 ENCOUNTER — Encounter (HOSPITAL_COMMUNITY): Payer: Self-pay | Admitting: Gastroenterology

## 2020-02-13 ENCOUNTER — Ambulatory Visit: Payer: 59 | Admitting: Family Medicine

## 2020-02-14 ENCOUNTER — Other Ambulatory Visit (INDEPENDENT_AMBULATORY_CARE_PROVIDER_SITE_OTHER): Payer: Self-pay | Admitting: Gastroenterology

## 2020-02-14 DIAGNOSIS — R1013 Epigastric pain: Secondary | ICD-10-CM

## 2020-02-20 ENCOUNTER — Other Ambulatory Visit (INDEPENDENT_AMBULATORY_CARE_PROVIDER_SITE_OTHER): Payer: Self-pay | Admitting: Gastroenterology

## 2020-02-20 DIAGNOSIS — R1013 Epigastric pain: Secondary | ICD-10-CM

## 2020-03-30 ENCOUNTER — Other Ambulatory Visit (INDEPENDENT_AMBULATORY_CARE_PROVIDER_SITE_OTHER): Payer: Self-pay | Admitting: Gastroenterology

## 2020-03-30 DIAGNOSIS — R1013 Epigastric pain: Secondary | ICD-10-CM

## 2020-04-10 ENCOUNTER — Ambulatory Visit (INDEPENDENT_AMBULATORY_CARE_PROVIDER_SITE_OTHER): Payer: 59 | Admitting: Gastroenterology

## 2020-04-10 ENCOUNTER — Telehealth (INDEPENDENT_AMBULATORY_CARE_PROVIDER_SITE_OTHER): Payer: Self-pay

## 2020-04-10 NOTE — Telephone Encounter (Signed)
noted 

## 2020-04-10 NOTE — Telephone Encounter (Signed)
Patient no showed for her appointment on 04/10/2020. [name] would you please see when she wants to reschedule.Thank you!

## 2022-02-16 IMAGING — US US ABDOMEN LIMITED
1 series · 14 of 25 positions shown · non-contrast
Comparison: 12/06/2013

CLINICAL DATA: Right upper quadrant pain

EXAM:
ULTRASOUND ABDOMEN LIMITED RIGHT UPPER QUADRANT

[Series 1: us abdomen limited · 14 of 41 slices shown]
[im 1/41]
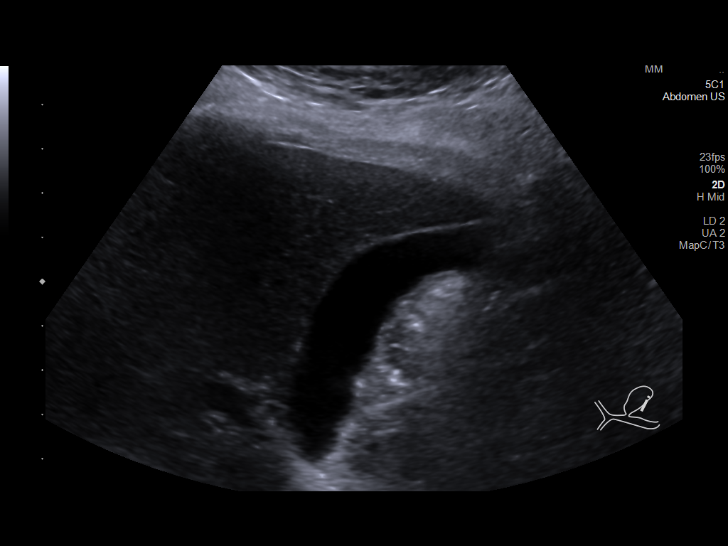
[im 4/41]
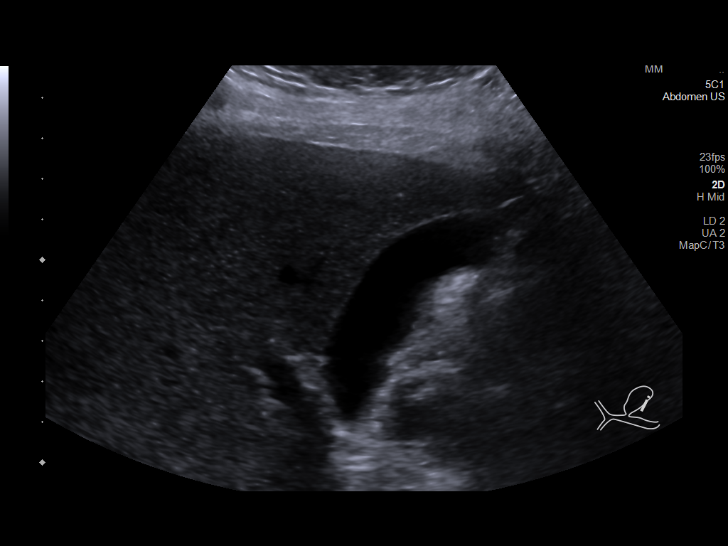
[im 7/41]
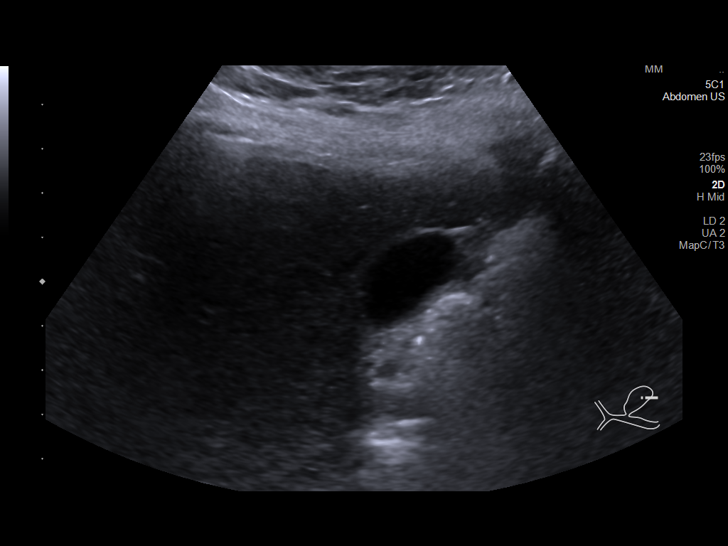
[im 11/41]
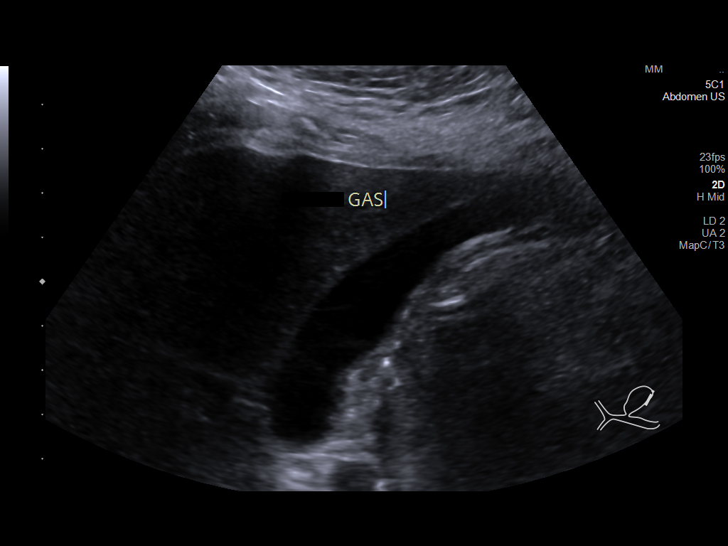
[im 14/41]
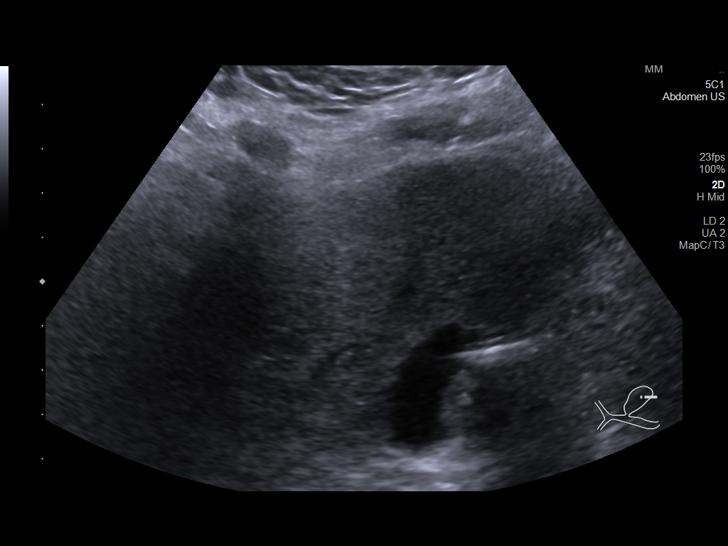
[im 16/41]
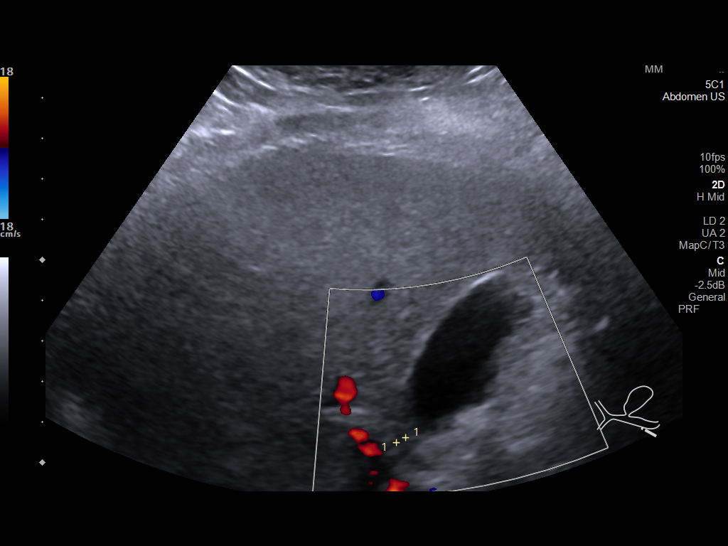
[im 19/41]
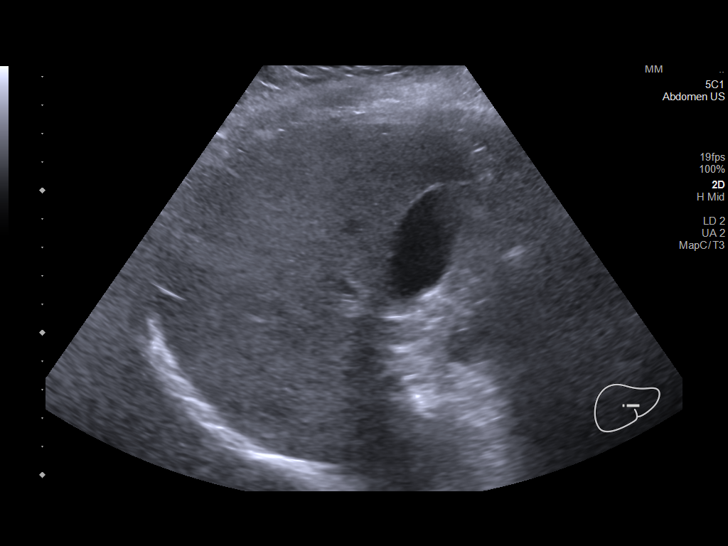
[im 22/41]
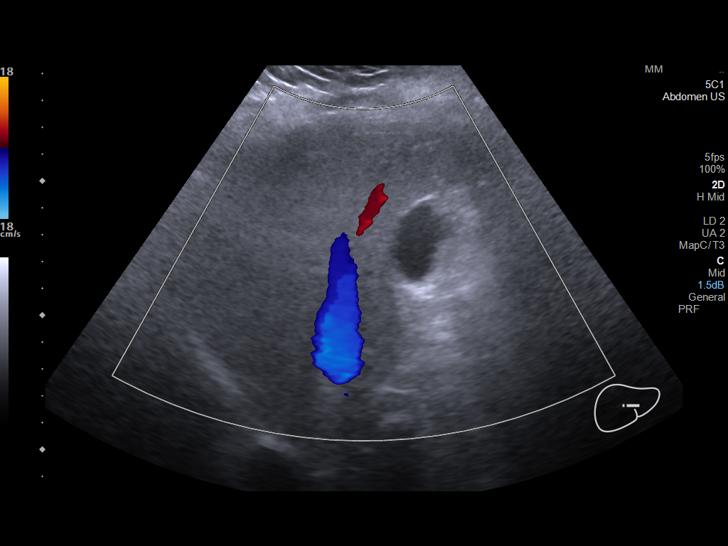
[im 26/41]
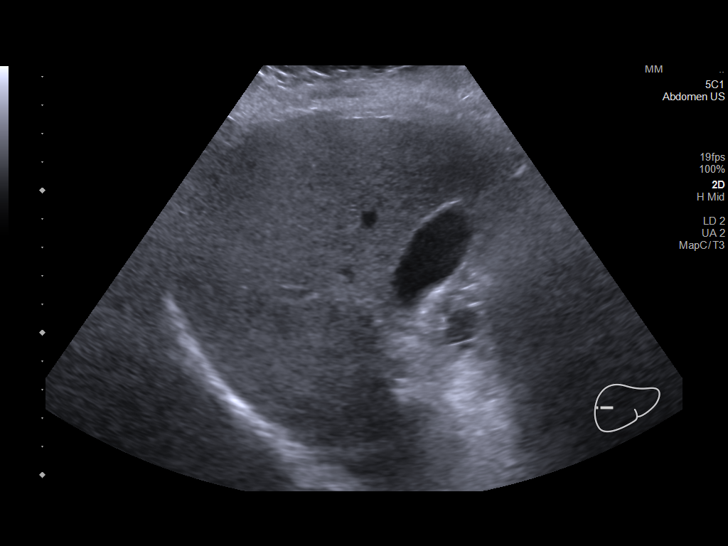
[im 27/41]
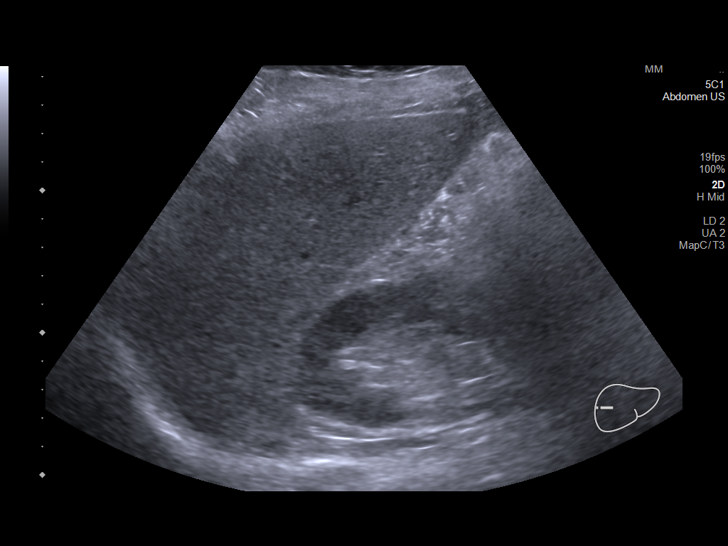
[im 31/41]
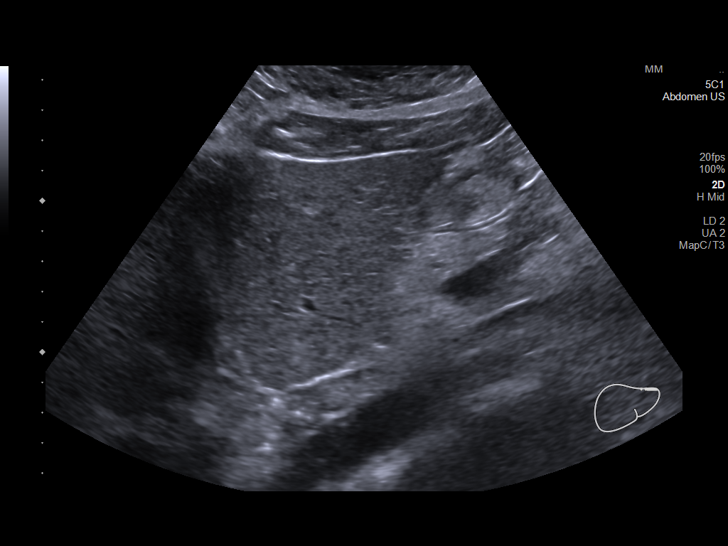
[im 34/41]
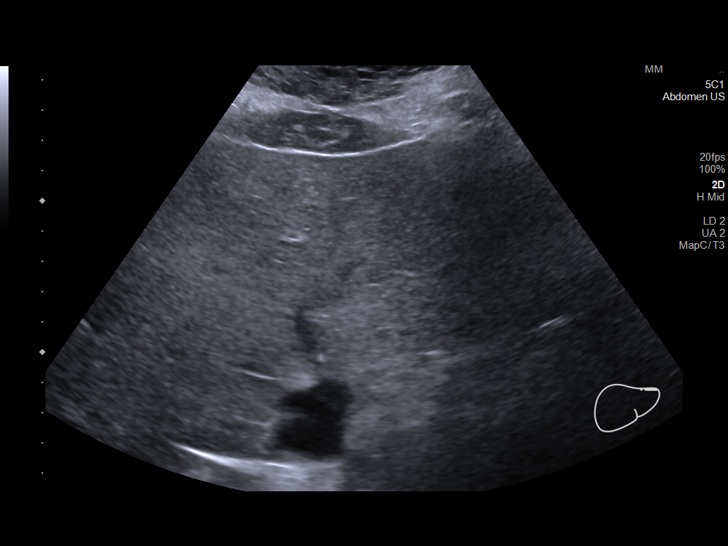
[im 37/41]
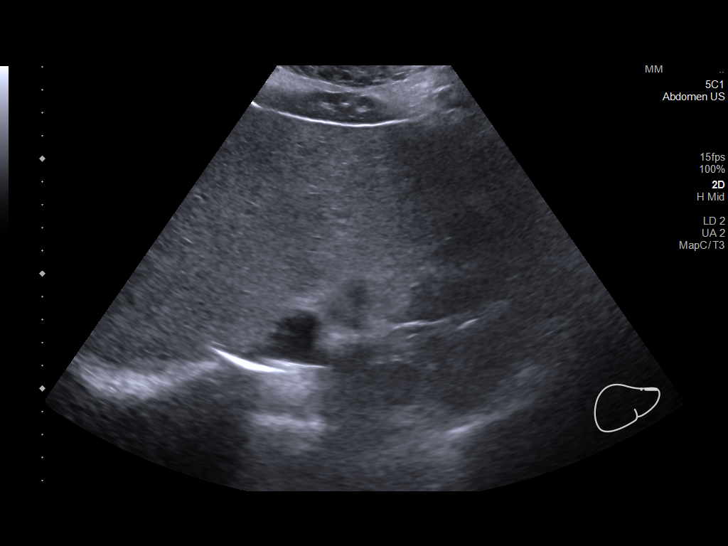
[im 41/41]
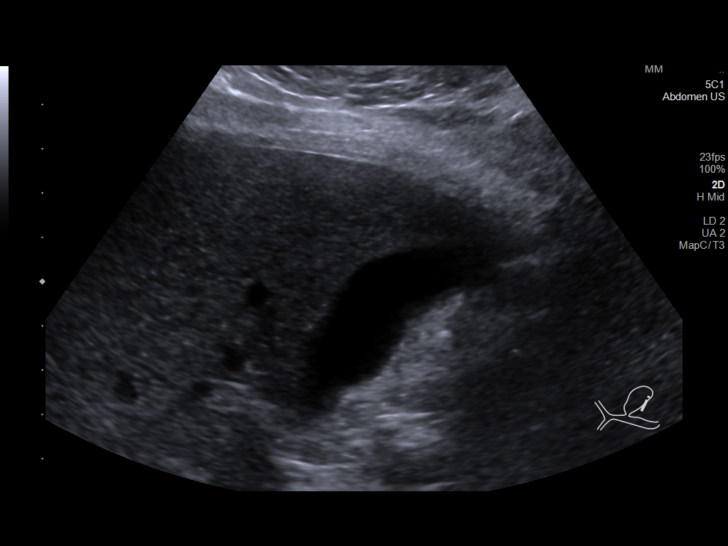

[14 of 25 positions shown; findings below may reference images not displayed]

FINDINGS: Gallbladder:

No gallstones or wall thickening visualized. No sonographic Murphy
sign noted by sonographer.

Common bile duct:

Diameter: 2.6 mm.

Liver:

Increased in echogenicity without focal mass. Portal vein is patent
on color Doppler imaging with normal direction of blood flow towards
the liver.

Other: None.
IMPRESSION: Fatty infiltration of the liver.  No acute abnormality noted.

## 2022-12-09 ENCOUNTER — Inpatient Hospital Stay (HOSPITAL_COMMUNITY)
Admission: EM | Admit: 2022-12-09 | Discharge: 2022-12-11 | DRG: 563 | Disposition: A | Discharge status: Home Health | Payer: Medicaid Other | Attending: Internal Medicine | Admitting: Internal Medicine

## 2022-12-09 ENCOUNTER — Emergency Department (HOSPITAL_COMMUNITY): Payer: Medicaid Other

## 2022-12-09 ENCOUNTER — Other Ambulatory Visit: Payer: Self-pay

## 2022-12-09 ENCOUNTER — Encounter (HOSPITAL_COMMUNITY): Payer: Self-pay | Admitting: *Deleted

## 2022-12-09 DIAGNOSIS — W1830XA Fall on same level, unspecified, initial encounter: Secondary | ICD-10-CM | POA: Diagnosis present

## 2022-12-09 DIAGNOSIS — Z79899 Other long term (current) drug therapy: Secondary | ICD-10-CM

## 2022-12-09 DIAGNOSIS — S82401A Unspecified fracture of shaft of right fibula, initial encounter for closed fracture: Secondary | ICD-10-CM | POA: Diagnosis not present

## 2022-12-09 DIAGNOSIS — Z9851 Tubal ligation status: Secondary | ICD-10-CM

## 2022-12-09 DIAGNOSIS — S82899A Other fracture of unspecified lower leg, initial encounter for closed fracture: Secondary | ICD-10-CM | POA: Diagnosis present

## 2022-12-09 DIAGNOSIS — M549 Dorsalgia, unspecified: Secondary | ICD-10-CM | POA: Diagnosis present

## 2022-12-09 DIAGNOSIS — Z8249 Family history of ischemic heart disease and other diseases of the circulatory system: Secondary | ICD-10-CM

## 2022-12-09 DIAGNOSIS — E669 Obesity, unspecified: Secondary | ICD-10-CM | POA: Diagnosis present

## 2022-12-09 DIAGNOSIS — Z6837 Body mass index (BMI) 37.0-37.9, adult: Secondary | ICD-10-CM

## 2022-12-09 DIAGNOSIS — G8929 Other chronic pain: Secondary | ICD-10-CM | POA: Diagnosis present

## 2022-12-09 DIAGNOSIS — S8264XA Nondisplaced fracture of lateral malleolus of right fibula, initial encounter for closed fracture: Secondary | ICD-10-CM | POA: Diagnosis present

## 2022-12-09 DIAGNOSIS — E079 Disorder of thyroid, unspecified: Secondary | ICD-10-CM | POA: Diagnosis present

## 2022-12-09 DIAGNOSIS — S82831A Other fracture of upper and lower end of right fibula, initial encounter for closed fracture: Principal | ICD-10-CM | POA: Diagnosis present

## 2022-12-09 DIAGNOSIS — K219 Gastro-esophageal reflux disease without esophagitis: Secondary | ICD-10-CM | POA: Diagnosis present

## 2022-12-09 DIAGNOSIS — F17210 Nicotine dependence, cigarettes, uncomplicated: Secondary | ICD-10-CM | POA: Diagnosis present

## 2022-12-09 DIAGNOSIS — S82832A Other fracture of upper and lower end of left fibula, initial encounter for closed fracture: Secondary | ICD-10-CM | POA: Diagnosis present

## 2022-12-09 DIAGNOSIS — Z9049 Acquired absence of other specified parts of digestive tract: Secondary | ICD-10-CM

## 2022-12-09 DIAGNOSIS — F419 Anxiety disorder, unspecified: Secondary | ICD-10-CM | POA: Diagnosis present

## 2022-12-09 DIAGNOSIS — S82402A Unspecified fracture of shaft of left fibula, initial encounter for closed fracture: Secondary | ICD-10-CM | POA: Diagnosis present

## 2022-12-09 DIAGNOSIS — E876 Hypokalemia: Secondary | ICD-10-CM | POA: Diagnosis not present

## 2022-12-09 DIAGNOSIS — I10 Essential (primary) hypertension: Secondary | ICD-10-CM | POA: Diagnosis present

## 2022-12-09 DIAGNOSIS — Z881 Allergy status to other antibiotic agents status: Secondary | ICD-10-CM

## 2022-12-09 DIAGNOSIS — X501XXA Overexertion from prolonged static or awkward postures, initial encounter: Secondary | ICD-10-CM

## 2022-12-09 DIAGNOSIS — F32A Depression, unspecified: Secondary | ICD-10-CM | POA: Diagnosis present

## 2022-12-09 LAB — BASIC METABOLIC PANEL
Anion gap: 8 (ref 5–15)
BUN: 18 mg/dL (ref 6–20)
CO2: 26 mmol/L (ref 22–32)
Calcium: 9.1 mg/dL (ref 8.9–10.3)
Chloride: 104 mmol/L (ref 98–111)
Creatinine, Ser: 0.82 mg/dL (ref 0.44–1.00)
GFR, Estimated: >60 mL/min (ref >60)
Glucose, Bld: 108 mg/dL — ABNORMAL HIGH (ref 70–99)
Potassium: 3.3 mmol/L — ABNORMAL LOW (ref 3.5–5.1)
Sodium: 138 mmol/L (ref 135–145)

## 2022-12-09 LAB — CBC
HCT: 39.2 % (ref 36.0–46.0)
Hemoglobin: 12.8 g/dL (ref 12.0–15.0)
MCH: 30.4 pg (ref 26.0–34.0)
MCHC: 32.7 g/dL (ref 30.0–36.0)
MCV: 93.1 fL (ref 80.0–100.0)
Platelets: 210 10*3/uL (ref 150–400)
RBC: 4.21 MIL/uL (ref 3.87–5.11)
RDW: 13.4 % (ref 11.5–15.5)
WBC: 6.9 10*3/uL (ref 4.0–10.5)
nRBC: 0 % (ref 0.0–0.2)

## 2022-12-09 MED ORDER — POLYETHYLENE GLYCOL 3350 17 G PO PACK: 17.0000 g | PACK | Freq: Every day | ORAL | Status: DC | PRN

## 2022-12-09 MED ORDER — HYDROXYZINE HCL 10 MG PO TABS
10.0000 mg | ORAL_TABLET | Freq: Once | ORAL | Status: AC | PRN
  Administered 2022-12-09: 10 mg via ORAL
  Filled 2022-12-09: qty 1

## 2022-12-09 MED ORDER — ONDANSETRON HCL 4 MG PO TABS: 4.0000 mg | ORAL_TABLET | Freq: Four times a day (QID) | ORAL | Status: DC | PRN

## 2022-12-09 MED ORDER — FENTANYL CITRATE PF 50 MCG/ML IJ SOSY
50.0000 ug | PREFILLED_SYRINGE | Freq: Once | INTRAMUSCULAR | Status: AC
  Administered 2022-12-09: 50 ug via INTRAVENOUS
  Filled 2022-12-09 (×2): qty 1

## 2022-12-09 MED ORDER — HYDROMORPHONE HCL 1 MG/ML IJ SOLN
1.0000 mg | Freq: Once | INTRAMUSCULAR | Status: AC
  Administered 2022-12-09: 1 mg via INTRAVENOUS
  Filled 2022-12-09: qty 1

## 2022-12-09 MED ORDER — PANTOPRAZOLE SODIUM 40 MG PO TBEC
40.0000 mg | DELAYED_RELEASE_TABLET | Freq: Two times a day (BID) | ORAL | Status: DC
  Administered 2022-12-09 – 2022-12-11 (×4): 40 mg via ORAL
  Filled 2022-12-09 (×4): qty 1

## 2022-12-09 MED ORDER — POTASSIUM CHLORIDE CRYS ER 20 MEQ PO TBCR
40.0000 meq | EXTENDED_RELEASE_TABLET | ORAL | Status: AC
  Administered 2022-12-09 (×2): 40 meq via ORAL
  Filled 2022-12-09 (×2): qty 2

## 2022-12-09 MED ORDER — HYDROMORPHONE HCL 1 MG/ML IJ SOLN
1.0000 mg | INTRAMUSCULAR | Status: DC | PRN
  Administered 2022-12-09 – 2022-12-10 (×4): 1 mg via INTRAVENOUS
  Filled 2022-12-09 (×4): qty 1

## 2022-12-09 MED ORDER — ONDANSETRON HCL 4 MG/2ML IJ SOLN
4.0000 mg | Freq: Four times a day (QID) | INTRAMUSCULAR | Status: DC | PRN
  Administered 2022-12-10 – 2022-12-11 (×3): 4 mg via INTRAVENOUS
  Filled 2022-12-09 (×3): qty 2

## 2022-12-09 MED ORDER — HYDROCODONE-ACETAMINOPHEN 5-325 MG PO TABS
1.0000 | ORAL_TABLET | Freq: Once | ORAL | Status: AC
  Administered 2022-12-09: 1 via ORAL
  Filled 2022-12-09: qty 1

## 2022-12-09 MED ORDER — DIAZEPAM 5 MG/ML IJ SOLN
2.5000 mg | Freq: Once | INTRAMUSCULAR | Status: AC
  Administered 2022-12-09: 2.5 mg via INTRAVENOUS
  Filled 2022-12-09: qty 2

## 2022-12-09 NOTE — Assessment & Plan Note (Signed)
Mild. K- 3.3 - replete

## 2022-12-09 NOTE — ED Notes (Signed)
EDPA Provider at bedside. 

## 2022-12-09 NOTE — ED Triage Notes (Signed)
Pt with fall today after stepping into a hole while walking in the yard today.  Denies hitting her head with the fall.  Not able to put weight on left but some on right.

## 2022-12-09 NOTE — H&P (Signed)
History and Physical    [name] YQM:[id number] DOB: 03/31/1971 DOA: 12/09/2022  PCP: Patient, No Pcp Per   Patient coming from: Home  I have personally briefly reviewed patient's old medical records in [facility]  Chief Complaint: FALL  HPI: [name] is a 52 y.o. female with medical history significant for tobacco abuse, anxiety depression hypertension. Patient presented to the ED with reports of a fall and subsequent bilateral ankle pain.  She reports she was working in her yard when she stepped into water on the floor made by tire marks, she twisted both ankles.  She is unable to ambulate.  She did not lose consciousness, she did not hit her head. She otherwise has no other complaints apart from epigastric burning, from chronic GERD takes omeprazole and reports EGD in the past.  No melena or blood in stools,  ED Course: Stable vitals.  X-rays show-nondisplaced distal fibular fracture on the right, comminuted fracture of distal fibula on the left.  [facility] orthopedics consulted, recommended cam boot, and splint for now.  Will see in consult.  Review of Systems: As per HPI all other systems reviewed and negative.  Past Medical History:  Diagnosis Date   Anemia    Anxiety    Back pain, chronic    Depression    GERD (gastroesophageal reflux disease)    Hypertension    Thyroid disease     Past Surgical History:  Procedure Laterality Date   APPENDECTOMY     BIOPSY  02/06/2020   Procedure: BIOPSY;  Surgeon: Dolores Frame, MD;  Location: [facility];  Service: Gastroenterology;;  gastric    ESOPHAGOGASTRODUODENOSCOPY (EGD) WITH PROPOFOL N/A 02/06/2020   Procedure: ESOPHAGOGASTRODUODENOSCOPY (EGD) WITH PROPOFOL;  Surgeon: Dolores Frame, MD;  Location: [facility];  Service: Gastroenterology;  Laterality: N/A;  1015   TUBAL LIGATION       reports that she has been smoking cigarettes. She has a 5.00 pack-year smoking history. She has never  used smokeless tobacco. She reports current alcohol use. She reports that she does not use drugs.  Allergies  Allergen Reactions   Biaxin [Clarithromycin]     Sick on stomach    Family History  Problem Relation Age of Onset   Stroke Mother    Hypertension Mother    COPD Mother    Cancer Father        prostate cancer    Prior to Admission medications   Medication Sig Start Date End Date Taking? Authorizing Provider  albuterol (PROVENTIL HFA;VENTOLIN HFA) 108 (90 Base) MCG/ACT inhaler Inhale 1-2 puffs into the lungs every 6 (six) hours as needed for wheezing or shortness of breath. Patient not taking: Reported on 12/01/2019 11/02/17   Maia Plan, MD  fluticasone [facility]) 50 MCG/ACT nasal spray Place 2 sprays into both nostrils daily.    [provider]  lisinopril (ZESTRIL) 10 MG tablet Take 1 tablet (10 mg total) by mouth daily. Patient not taking: Reported on 01/18/2020 12/01/19   Dione Booze, MD  loratadine (CLARITIN) 10 MG tablet Take 10 mg by mouth daily.    [provider]  omeprazole (PRILOSEC) 40 MG capsule TAKE 1 CAPSULE(40 MG) BY MOUTH IN THE MORNING AND AT BEDTIME 03/31/20   Tawni Pummel B, PA-C  ferrous sulfate 325 (65 FE) MG tablet Take 1 tablet (325 mg total) by mouth daily. 11/08/17 01/08/19  Eber Hong, MD    Physical Exam: Vitals:   12/09/22 1345 12/09/22 1600  BP: Marland Kitchen)  156/95 (!) 146/80  Pulse: 80 70  Resp: 18 18  Temp: 98.7 F (37.1 C)   TempSrc: Oral   SpO2: 96% 100%  Weight: 90.7 kg   Height: 5\' 1"  (1.549 m)     Constitutional: NAD, calm, comfortable Vitals:   12/09/22 1345 12/09/22 1600  BP: (!) 156/95 (!) 146/80  Pulse: 80 70  Resp: 18 18  Temp: 98.7 F (37.1 C)   TempSrc: Oral   SpO2: 96% 100%  Weight: 90.7 kg   Height: 5\' 1"  (1.549 m)    Eyes: PERRL, lids and conjunctivae normal ENMT: Mucous membranes are moist.   Neck: normal, supple, no masses, no thyromegaly Respiratory: clear to auscultation bilaterally, no  wheezing, no crackles. Normal respiratory effort. No accessory muscle use.  Cardiovascular: Regular rate and rhythm, no murmurs / rubs / gallops. No extremity edema.  There is 1 Abdomen: no tenderness, no masses palpated. No hepatosplenomegaly. Bowel sounds positive.  Musculoskeletal: no clubbing / cyanosis. No joint deformity upper and lower extremities.  Skin: no rashes, lesions, ulcers. No induration Neurologic: No apparent cranial nerve abnormality, open extremities spontaneously Psychiatric: Normal judgment and insight. Alert and oriented x 3. Normal mood.   Labs on Admission: I have personally reviewed following labs and imaging studies  CBC: Recent Labs  Lab 12/09/22 1714  WBC 6.9  HGB 12.8  HCT 39.2  MCV 93.1  PLT 210   Basic Metabolic Panel: Recent Labs  Lab 12/09/22 1714  NA 138  K 3.3*  CL 104  CO2 26  GLUCOSE 108*  BUN 18  CREATININE 0.82  CALCIUM 9.1   Radiological Exams on Admission: DG Knee Complete 4 Views Left  Result Date: 12/09/2022 CLINICAL DATA:  Pain after recent fall EXAM: LEFT KNEE - COMPLETE 4 VIEW COMPARISON:  None Available. FINDINGS: No evidence of fracture, dislocation, or joint effusion. No evidence of arthropathy or other focal bone abnormality. Soft tissues are unremarkable. IMPRESSION: Negative. Electronically Signed   By: Jacob Moores M.D.   On: 12/09/2022 14:47   DG Ankle Complete Left  Result Date: 12/09/2022 CLINICAL DATA:  Status post fall today. Lateral ankle pain and swelling. EXAM: LEFT ANKLE COMPLETE - 3+ VIEW COMPARISON:  None Available. FINDINGS: There is a comminuted acute fracture of the distal fibular diaphysis involving an approximate 5 cm length of the bone. There is up to approximately 3 mm diastasis of the fracture components. Up to approximately 2 mm cortical step-off at the lateral aspect of the fracture. There is a triangular cortical fragment measuring approximately 1.5 cm in length that is angled posteriorly,  resulting in up to approximately 6 mm posterior step-off seen on lateral view and 3 mm medial cortical step-off seen on frontal view. The ankle mortise is symmetric and intact. Tiny plantar calcaneal heel spur. IMPRESSION: Acute comminuted fracture of the distal fibular diaphysis with up to 3 mm diastasis of the fracture components. Electronically Signed   By: Neita Garnet M.D.   On: 12/09/2022 14:31   DG Ankle Complete Right  Result Date: 12/09/2022 CLINICAL DATA:  Fall today.  Bilateral ankle pain and swelling. EXAM: RIGHT ANKLE - COMPLETE 3+ VIEW COMPARISON:  Right ankle radiographs 04/03/2012 FINDINGS: There is subtle predominately transverse mildly curvilinear lucency within the distal fibula approximately 6 mm proximal to the distal tip, an acute nondisplaced fracture. Mild lateral malleolar soft tissue swelling. The ankle mortise is symmetric and intact. Mild-to-moderate dorsal talonavicular degenerative osteophytosis is slightly worsened from prior. Interval healing of the prior  nondisplaced proximal fifth metatarsal fracture seen on remote 2013 radiographs. IMPRESSION: Acute nondisplaced fracture of the distal fibula approximately 6 mm proximal to the distal tip. Electronically Signed   By: Neita Garnet M.D.   On: 12/09/2022 14:29    EKG: None  Assessment/Plan Principal Problem:   Bilateral fibular fractures Active Problems:   Chronic GERD   Hypokalemia  Assessment and Plan: * Bilateral fibular fractures Bilateral ankle fracture secondary to mechanical fall.  X-ray of the right ankle shows nondisplaced distal fibula fracture, left ankle shows comminuted fracture of the distal fibula. - Dr. Romeo Apple consulted by EDP, will see in consult, apply cam boot, and splint.  -IV Dilaudid 1 mg every 4 hourly. -N.p.o. midnight pending Ortho eval  Hypokalemia Mild. K- 3.3 - replete  Chronic GERD Epigastric burning with any meals, improved with omeprazole- she takes multiple doses daily.  She  takes about 600 mg dose ibuprofen about 3 times a week.  EGD 2021 for epigastric pain sleep normal, pathology - mild reactive gastropathy and mild chronic gastritis, negative for Helicobacter to pylori on pathology. - Resume omeprazole   DVT prophylaxis: SCDS- pending ortho eval Code Status: FULL Family Communication: None at bedside Disposition Plan:  Pending ortho eval, 1 - 2 days Consults called: Ortho  Admission status:  Obs Med surg   Author: Onnie Boer, MD 12/09/2022 6:46 PM  For on call review [email or web address].

## 2022-12-09 NOTE — Assessment & Plan Note (Signed)
Bilateral ankle fracture secondary to mechanical fall.  X-ray of the right ankle shows nondisplaced distal fibula fracture, left ankle shows comminuted fracture of the distal fibula. - Dr. Romeo Apple consulted by EDP, will see in consult, apply cam boot, and splint.  -IV Dilaudid 1 mg every 4 hourly. -N.p.o. midnight pending Ortho eval

## 2022-12-09 NOTE — ED Notes (Signed)
Pt transferred from recliner to bed. Pt now resting in bed.

## 2022-12-09 NOTE — Plan of Care (Signed)
  Problem: Coping: Goal: Level of anxiety will decrease Outcome: Progressing   Problem: Pain Managment: Goal: General experience of comfort will improve Outcome: Progressing   Problem: Safety: Goal: Ability to remain free from injury will improve Outcome: Progressing   

## 2022-12-09 NOTE — Assessment & Plan Note (Signed)
Epigastric burning with any meals, improved with omeprazole- she takes multiple doses daily.  She takes about 600 mg dose ibuprofen about 3 times a week.  EGD 2021 for epigastric pain sleep normal, pathology - mild reactive gastropathy and mild chronic gastritis, negative for Helicobacter to pylori on pathology. - Resume omeprazole

## 2022-12-09 NOTE — ED Provider Notes (Signed)
[place] EMERGENCY DEPARTMENT AT [facility] Provider Note   CSN: [id number] Arrival date & time: 12/09/22  1328     History  Chief Complaint  Patient presents with   Ankle Pain    [name] is a 52 y.o. female.  With a history of anxiety, depression, hypertension, GERD who presents to the ED for evaluation of bilateral ankle pain.  She states she was walking in her garden with tennis shoes on when she stepped in a rut that was caused by a tire with her left foot.  She states she twisted her ankle at that time.  She then stepped in the same order with her right foot and twisted that ankle.  She fell forward onto her left knee.  She states she felt both of her ankles and her left knee pop.  She did not hit her head or lose consciousness.  Does not take any blood thinners.  Has been unable to walk since the accident.  This occurred approximately 1.5 hours prior to arrival.  She has not taken anything for pain prior to arrival.  Denies numbness, weakness or tingling.   Ankle Pain      Home Medications Prior to Admission medications   Medication Sig Start Date End Date Taking? Authorizing Provider  albuterol (PROVENTIL HFA;VENTOLIN HFA) 108 (90 Base) MCG/ACT inhaler Inhale 1-2 puffs into the lungs every 6 (six) hours as needed for wheezing or shortness of breath. Patient not taking: Reported on 12/01/2019 11/02/17   Maia Plan, MD  fluticasone [facility]) 50 MCG/ACT nasal spray Place 2 sprays into both nostrils daily.    [provider]  lisinopril (ZESTRIL) 10 MG tablet Take 1 tablet (10 mg total) by mouth daily. Patient not taking: Reported on 01/18/2020 12/01/19   Dione Booze, MD  loratadine (CLARITIN) 10 MG tablet Take 10 mg by mouth daily.    [provider]  omeprazole (PRILOSEC) 40 MG capsule TAKE 1 CAPSULE(40 MG) BY MOUTH IN THE MORNING AND AT BEDTIME 03/31/20   Tawni Pummel B, PA-C  ferrous sulfate 325 (65 FE) MG tablet Take 1 tablet (325 mg  total) by mouth daily. 11/08/17 01/08/19  Eber Hong, MD      Allergies    Biaxin [clarithromycin]    Review of Systems   Review of Systems  Physical Exam Updated Vital Signs BP (!) 146/80   Pulse 70   Temp 98.7 F (37.1 C) (Oral)   Resp 18   Ht 5\' 1"  (1.549 m)   Wt 90.7 kg   LMP 01/07/2020   SpO2 100%   BMI 37.79 kg/m  Physical Exam Vitals and nursing note reviewed.  Constitutional:      General: She is not in acute distress.    Appearance: Normal appearance. She is normal weight. She is not ill-appearing.  HENT:     Head: Normocephalic and atraumatic.  Pulmonary:     Effort: Pulmonary effort is normal. No respiratory distress.  Abdominal:     General: Abdomen is flat.  Musculoskeletal:     Cervical back: Neck supple.     Comments: Mild tenderness to bilateral malleoli of the left ankle without obvious deformity or swelling or bruising.  Significant TTP to the right lateral malleolus.  No TTP to the medial malleolus.  Decreased ROM of bilateral ankles in dorsiflexion and plantar flexion secondary to pain.  DP pulses 2+ bilaterally.  Capillary refill less than 2 seconds.  Sensation intact in all digits.  No TTP  or joint space laxity of bilateral knees.  No TTP to the bilateral feet  Skin:    General: Skin is warm and dry.  Neurological:     Mental Status: She is alert and oriented to person, place, and time.  Psychiatric:     Comments: anxious     ED Results / Procedures / Treatments   Labs (all labs ordered are listed, but only abnormal results are displayed) Labs Reviewed  BASIC METABOLIC PANEL - Abnormal; Notable for the following components:      Result Value   Potassium 3.3 (*)    Glucose, Bld 108 (*)    All other components within normal limits  CBC    EKG None  Radiology DG Knee Complete 4 Views Left  Result Date: 12/09/2022 CLINICAL DATA:  Pain after recent fall EXAM: LEFT KNEE - COMPLETE 4 VIEW COMPARISON:  None Available. FINDINGS: No evidence  of fracture, dislocation, or joint effusion. No evidence of arthropathy or other focal bone abnormality. Soft tissues are unremarkable. IMPRESSION: Negative. Electronically Signed   By: Jacob Moores M.D.   On: 12/09/2022 14:47   DG Ankle Complete Left  Result Date: 12/09/2022 CLINICAL DATA:  Status post fall today. Lateral ankle pain and swelling. EXAM: LEFT ANKLE COMPLETE - 3+ VIEW COMPARISON:  None Available. FINDINGS: There is a comminuted acute fracture of the distal fibular diaphysis involving an approximate 5 cm length of the bone. There is up to approximately 3 mm diastasis of the fracture components. Up to approximately 2 mm cortical step-off at the lateral aspect of the fracture. There is a triangular cortical fragment measuring approximately 1.5 cm in length that is angled posteriorly, resulting in up to approximately 6 mm posterior step-off seen on lateral view and 3 mm medial cortical step-off seen on frontal view. The ankle mortise is symmetric and intact. Tiny plantar calcaneal heel spur. IMPRESSION: Acute comminuted fracture of the distal fibular diaphysis with up to 3 mm diastasis of the fracture components. Electronically Signed   By: Neita Garnet M.D.   On: 12/09/2022 14:31   DG Ankle Complete Right  Result Date: 12/09/2022 CLINICAL DATA:  Fall today.  Bilateral ankle pain and swelling. EXAM: RIGHT ANKLE - COMPLETE 3+ VIEW COMPARISON:  Right ankle radiographs 04/03/2012 FINDINGS: There is subtle predominately transverse mildly curvilinear lucency within the distal fibula approximately 6 mm proximal to the distal tip, an acute nondisplaced fracture. Mild lateral malleolar soft tissue swelling. The ankle mortise is symmetric and intact. Mild-to-moderate dorsal talonavicular degenerative osteophytosis is slightly worsened from prior. Interval healing of the prior nondisplaced proximal fifth metatarsal fracture seen on remote 2013 radiographs. IMPRESSION: Acute nondisplaced fracture of the  distal fibula approximately 6 mm proximal to the distal tip. Electronically Signed   By: Neita Garnet M.D.   On: 12/09/2022 14:29    Procedures Procedures    Medications Ordered in ED Medications  HYDROcodone-acetaminophen (NORCO/VICODIN) 5-325 MG per tablet 1 tablet (1 tablet Oral Given 12/09/22 1421)  fentaNYL (SUBLIMAZE) injection 50 mcg (50 mcg Intravenous Given 12/09/22 1548)  HYDROmorphone (DILAUDID) injection 1 mg (1 mg Intravenous Given 12/09/22 1654)  diazepam (VALIUM) injection 2.5 mg (2.5 mg Intravenous Given 12/09/22 1731)    ED Course/ Medical Decision Making/ A&P                             Medical Decision Making Amount and/or Complexity of Data Reviewed Labs: ordered. Radiology: ordered.  Risk  Prescription drug management.  This patient presents to the ED for concern of bilateral ankle pain, left knee pain, this involves an extensive number of treatment options. The differential diagnosis includes fracture, strain, sprain, contusion  Co morbidities that complicate the patient evaluation  anxiety, depression, hypertension, GERD  My initial workup includes imaging, pain control  Additional history obtained from: Nursing notes from this visit.  I ordered imaging studies including x-ray of bilateral ankles, left knee I independently visualized and interpreted imaging which showed comminuted fracture of distal fibula of left, nondisplaced fracture of distal fibula of right. Normal knee x ray I agree with the radiologist interpretation  Consultations Obtained:  I requested consultation with orthopedics Dr. Romeo Apple,  and discussed lab and imaging findings as well as pertinent plan - they recommend: CAM boot to right foot, splint to left foot, admit for pain control if needed  I consulted with hospitalist Dr. Mariea Clonts who will admit  Afebrile, slightly hypertensive otherwise hemodynamically stable.  52 year old female presents ED for evaluation of a fall.  She  states she rolled both her ankles.  She has been unable to walk since that time.  On exam she is in a moderate amount pain. She has tenderness to bilateral lateral ankles consistent with her distal fibula fractures. Her neurovascular status is intact. No overlying lesions to suggest open fracture. Patient does not believe she can walk despite multiple rounds of analgesics. Will place CAM boot, splint and admit to hospitalist for pain control and orthopedic consult.   Patient's case discussed with Dr. Estell Harpin.   Note: Portions of this report may have been transcribed using voice recognition software. Every effort was made to ensure accuracy; however, inadvertent computerized transcription errors may still be present.        Final Clinical Impression(s) / ED Diagnoses Final diagnoses:  Closed fracture of both fibulae, initial encounter    Rx / DC Orders ED Discharge Orders     None         Mora Bellman 12/09/22 1811    Bethann Berkshire, MD 12/12/22 1207

## 2022-12-10 ENCOUNTER — Inpatient Hospital Stay (HOSPITAL_COMMUNITY): Payer: Medicaid Other

## 2022-12-10 DIAGNOSIS — Z6837 Body mass index (BMI) 37.0-37.9, adult: Secondary | ICD-10-CM | POA: Diagnosis not present

## 2022-12-10 DIAGNOSIS — S82831A Other fracture of upper and lower end of right fibula, initial encounter for closed fracture: Secondary | ICD-10-CM | POA: Diagnosis present

## 2022-12-10 DIAGNOSIS — W1830XA Fall on same level, unspecified, initial encounter: Secondary | ICD-10-CM | POA: Diagnosis present

## 2022-12-10 DIAGNOSIS — S82892A Other fracture of left lower leg, initial encounter for closed fracture: Secondary | ICD-10-CM

## 2022-12-10 DIAGNOSIS — K219 Gastro-esophageal reflux disease without esophagitis: Secondary | ICD-10-CM | POA: Diagnosis present

## 2022-12-10 DIAGNOSIS — F17210 Nicotine dependence, cigarettes, uncomplicated: Secondary | ICD-10-CM | POA: Diagnosis present

## 2022-12-10 DIAGNOSIS — S82899A Other fracture of unspecified lower leg, initial encounter for closed fracture: Secondary | ICD-10-CM | POA: Diagnosis present

## 2022-12-10 DIAGNOSIS — F32A Depression, unspecified: Secondary | ICD-10-CM | POA: Diagnosis present

## 2022-12-10 DIAGNOSIS — S8264XA Nondisplaced fracture of lateral malleolus of right fibula, initial encounter for closed fracture: Secondary | ICD-10-CM | POA: Diagnosis present

## 2022-12-10 DIAGNOSIS — X501XXA Overexertion from prolonged static or awkward postures, initial encounter: Secondary | ICD-10-CM | POA: Diagnosis not present

## 2022-12-10 DIAGNOSIS — G8929 Other chronic pain: Secondary | ICD-10-CM | POA: Diagnosis present

## 2022-12-10 DIAGNOSIS — M549 Dorsalgia, unspecified: Secondary | ICD-10-CM | POA: Diagnosis present

## 2022-12-10 DIAGNOSIS — I10 Essential (primary) hypertension: Secondary | ICD-10-CM | POA: Diagnosis present

## 2022-12-10 DIAGNOSIS — E876 Hypokalemia: Secondary | ICD-10-CM | POA: Diagnosis present

## 2022-12-10 DIAGNOSIS — Z8249 Family history of ischemic heart disease and other diseases of the circulatory system: Secondary | ICD-10-CM | POA: Diagnosis not present

## 2022-12-10 DIAGNOSIS — F419 Anxiety disorder, unspecified: Secondary | ICD-10-CM | POA: Diagnosis present

## 2022-12-10 DIAGNOSIS — S82402A Unspecified fracture of shaft of left fibula, initial encounter for closed fracture: Secondary | ICD-10-CM | POA: Diagnosis not present

## 2022-12-10 DIAGNOSIS — E079 Disorder of thyroid, unspecified: Secondary | ICD-10-CM | POA: Diagnosis present

## 2022-12-10 DIAGNOSIS — S82401A Unspecified fracture of shaft of right fibula, initial encounter for closed fracture: Secondary | ICD-10-CM | POA: Diagnosis present

## 2022-12-10 DIAGNOSIS — Z79899 Other long term (current) drug therapy: Secondary | ICD-10-CM | POA: Diagnosis not present

## 2022-12-10 DIAGNOSIS — Z9049 Acquired absence of other specified parts of digestive tract: Secondary | ICD-10-CM | POA: Diagnosis not present

## 2022-12-10 DIAGNOSIS — S82891A Other fracture of right lower leg, initial encounter for closed fracture: Secondary | ICD-10-CM | POA: Diagnosis not present

## 2022-12-10 DIAGNOSIS — Z9851 Tubal ligation status: Secondary | ICD-10-CM | POA: Diagnosis not present

## 2022-12-10 DIAGNOSIS — E669 Obesity, unspecified: Secondary | ICD-10-CM | POA: Diagnosis present

## 2022-12-10 DIAGNOSIS — Z881 Allergy status to other antibiotic agents status: Secondary | ICD-10-CM | POA: Diagnosis not present

## 2022-12-10 DIAGNOSIS — S82832A Other fracture of upper and lower end of left fibula, initial encounter for closed fracture: Secondary | ICD-10-CM | POA: Diagnosis present

## 2022-12-10 LAB — BASIC METABOLIC PANEL
Anion gap: 8 (ref 5–15)
BUN: 18 mg/dL (ref 6–20)
CO2: 25 mmol/L (ref 22–32)
Calcium: 9 mg/dL (ref 8.9–10.3)
Chloride: 106 mmol/L (ref 98–111)
Creatinine, Ser: 0.88 mg/dL (ref 0.44–1.00)
GFR, Estimated: >60 mL/min (ref >60)
Glucose, Bld: 88 mg/dL (ref 70–99)
Potassium: 4.2 mmol/L (ref 3.5–5.1)
Sodium: 139 mmol/L (ref 135–145)

## 2022-12-10 LAB — HIV ANTIBODY (ROUTINE TESTING W REFLEX): HIV Screen 4th Generation wRfx: NONREACTIVE

## 2022-12-10 LAB — CBC
HCT: 39.4 % (ref 36.0–46.0)
Hemoglobin: 12.6 g/dL (ref 12.0–15.0)
MCH: 30.4 pg (ref 26.0–34.0)
MCHC: 32 g/dL (ref 30.0–36.0)
MCV: 94.9 fL (ref 80.0–100.0)
Platelets: 204 10*3/uL (ref 150–400)
RBC: 4.15 MIL/uL (ref 3.87–5.11)
RDW: 13.5 % (ref 11.5–15.5)
WBC: 6 10*3/uL (ref 4.0–10.5)
nRBC: 0 % (ref 0.0–0.2)

## 2022-12-10 MED ORDER — OXYCODONE HCL 5 MG PO TABS
5.0000 mg | ORAL_TABLET | ORAL | Status: DC
  Administered 2022-12-10 – 2022-12-11 (×9): 5 mg via ORAL
  Filled 2022-12-10 (×9): qty 1

## 2022-12-10 MED ORDER — HYDROXYZINE HCL 10 MG PO TABS
10.0000 mg | ORAL_TABLET | Freq: Once | ORAL | Status: AC | PRN
  Administered 2022-12-10: 10 mg via ORAL
  Filled 2022-12-10: qty 1

## 2022-12-10 MED ORDER — PROMETHAZINE HCL 12.5 MG PO TABS
12.5000 mg | ORAL_TABLET | Freq: Four times a day (QID) | ORAL | Status: DC | PRN
  Administered 2022-12-10 – 2022-12-11 (×2): 12.5 mg via ORAL
  Filled 2022-12-10 (×2): qty 1

## 2022-12-10 MED ORDER — ACETAMINOPHEN 500 MG PO TABS
500.0000 mg | ORAL_TABLET | Freq: Four times a day (QID) | ORAL | Status: DC
  Administered 2022-12-10 – 2022-12-11 (×6): 500 mg via ORAL
  Filled 2022-12-10 (×6): qty 1

## 2022-12-10 MED ORDER — ENOXAPARIN SODIUM 40 MG/0.4ML IJ SOSY
40.0000 mg | PREFILLED_SYRINGE | INTRAMUSCULAR | Status: DC
  Administered 2022-12-10: 40 mg via SUBCUTANEOUS
  Filled 2022-12-10 (×2): qty 0.4

## 2022-12-10 MED ORDER — SODIUM CHLORIDE 0.9 % IV SOLN
12.5000 mg | Freq: Four times a day (QID) | INTRAVENOUS | Status: DC | PRN
  Administered 2022-12-10: 12.5 mg via INTRAVENOUS
  Filled 2022-12-10 (×2): qty 0.5

## 2022-12-10 MED ORDER — METHOCARBAMOL 1000 MG/10ML IJ SOLN
500.0000 mg | Freq: Four times a day (QID) | INTRAVENOUS | Status: DC
  Administered 2022-12-10 – 2022-12-11 (×5): 500 mg via INTRAVENOUS
  Filled 2022-12-10 (×3): qty 5

## 2022-12-10 MED ORDER — KETOROLAC TROMETHAMINE 15 MG/ML IJ SOLN
15.0000 mg | Freq: Once | INTRAMUSCULAR | Status: AC
  Administered 2022-12-10: 15 mg via INTRAVENOUS
  Filled 2022-12-10: qty 1

## 2022-12-10 MED ORDER — METHOCARBAMOL 1000 MG/10ML IJ SOLN
INTRAMUSCULAR | Status: AC
  Filled 2022-12-10: qty 10

## 2022-12-10 MED ORDER — HYDRALAZINE HCL 20 MG/ML IJ SOLN
10.0000 mg | INTRAMUSCULAR | Status: DC | PRN
  Administered 2022-12-10: 10 mg via INTRAVENOUS
  Filled 2022-12-10: qty 1

## 2022-12-10 MED ORDER — LIDOCAINE 5 % EX PTCH
1.0000 | MEDICATED_PATCH | CUTANEOUS | Status: DC
  Administered 2022-12-10 – 2022-12-11 (×2): 1 via TRANSDERMAL
  Filled 2022-12-10 (×2): qty 1

## 2022-12-10 MED ORDER — PROMETHAZINE HCL 12.5 MG RE SUPP: 12.5000 mg | Freq: Four times a day (QID) | RECTAL | Status: DC | PRN

## 2022-12-10 MED ORDER — IBUPROFEN 800 MG PO TABS
800.0000 mg | ORAL_TABLET | Freq: Four times a day (QID) | ORAL | Status: DC
  Administered 2022-12-10 – 2022-12-11 (×5): 800 mg via ORAL
  Filled 2022-12-10 (×2): qty 1
  Filled 2022-12-10: qty 2
  Filled 2022-12-10 (×4): qty 1

## 2022-12-10 MED ORDER — HYDROMORPHONE HCL 1 MG/ML IJ SOLN
1.0000 mg | INTRAMUSCULAR | Status: DC | PRN
  Administered 2022-12-10 – 2022-12-11 (×8): 1 mg via INTRAVENOUS
  Filled 2022-12-10 (×8): qty 1

## 2022-12-10 NOTE — Progress Notes (Signed)
PROGRESS NOTE    [name]  ZOX:[id number] DOB: October 13, 1970 DOA: 12/09/2022 PCP: Patient, No Pcp Per   Brief Narrative:  [name] is a 52 y.o. female with medical history significant for tobacco abuse, anxiety depression hypertension. Patient presented to the ED with reports of a fall and subsequent bilateral ankle pain.  She was noted to have bilateral fibula fractures and orthopedics is recommending conservative management.  She continues to have severe back pain at this time.  Assessment & Plan:   Principal Problem:   Bilateral fibular fractures Active Problems:   Chronic GERD   Hypokalemia   Ankle fracture  Assessment and Plan:  Bilateral fibular fractures Bilateral ankle fracture secondary to mechanical fall.  X-ray of the right ankle shows nondisplaced distal fibula fracture, left ankle shows comminuted fracture of the distal fibula. - Dr. Romeo Apple consulted with recommendations for nonoperative management at this time.  PT/OT evaluation pending due to ongoing severe pain with elevated blood pressure readings noted.  Severe back pain -Evaluation with x-rays underway -Continue IV Dilaudid every 2 hours as needed  Elevated blood pressures Does not appear to have history of high blood pressure Likely due to severe pain, continue to monitor IV hydralazine ordered as needed     Chronic GERD Epigastric burning with any meals, improved with omeprazole- she takes multiple doses daily.  She takes about 600 mg dose ibuprofen about 3 times a week.  EGD 2021 for epigastric pain sleep normal, pathology - mild reactive gastropathy and mild chronic gastritis, negative for Helicobacter to pylori on pathology. - Resume omeprazole  Obesity BMI 37.79    DVT prophylaxis: SCDs Code Status: Full Family Communication: None at bedside Disposition Plan:  Status is: Inpatient Remains inpatient appropriate because: Need for IV medications   Consultants:  Orthopedics-Dr.  Romeo Apple  Procedures:  None  Antimicrobials:  None   Subjective: Patient seen and evaluated today with significant distress and pain to her bilateral ankles as well as low back and rib cage.  Objective: Vitals:   12/10/22 0950 12/10/22 1223 12/10/22 1317 12/10/22 1330  BP: (!) 160/102 (!) 214/109 (!) 209/102 (!) 185/105  Pulse: 78 64 60 66  Resp: 20 (!) 40 20   Temp: 98.2 F (36.8 C) (!) 97.5 F (36.4 C) 98.6 F (37 C)   TempSrc: Oral Axillary Oral   SpO2: 100% 100% 100%   Weight:      Height:        Intake/Output Summary (Last 24 hours) at 12/10/2022 1422 Last data filed at 12/09/2022 2013 Gross per 24 hour  Intake 180 ml  Output --  Net 180 ml   Filed Weights   12/09/22 1345  Weight: 90.7 kg    Examination:  General exam: Appears to be anxious and in mild to moderate distress Respiratory system: Clear to auscultation. Respiratory effort normal. Cardiovascular system: S1 & S2 heard, RRR.  Gastrointestinal system: Abdomen is soft Central nervous system: Alert and awake Extremities: No edema Skin: No significant lesions noted Psychiatry: Flat affect.    Data Reviewed: I have personally reviewed following labs and imaging studies  CBC: Recent Labs  Lab 12/09/22 1714 12/10/22 0433  WBC 6.9 6.0  HGB 12.8 12.6  HCT 39.2 39.4  MCV 93.1 94.9  PLT 210 204   Basic Metabolic Panel: Recent Labs  Lab 12/09/22 1714 12/10/22 0433  NA 138 139  K 3.3* 4.2  CL 104 106  CO2 26 25  GLUCOSE 108* 88  BUN 18 18  CREATININE 0.82 0.88  CALCIUM 9.1 9.0   GFR: Estimated Creatinine Clearance: 78.5 mL/min (by C-G formula based on SCr of 0.88 mg/dL). Liver Function Tests: No results for input(s): "AST", "ALT", "ALKPHOS", "BILITOT", "PROT", "ALBUMIN" in the last 168 hours. No results for input(s): "LIPASE", "AMYLASE" in the last 168 hours. No results for input(s): "AMMONIA" in the last 168 hours. Coagulation Profile: No results for input(s): "INR", "PROTIME" in  the last 168 hours. Cardiac Enzymes: No results for input(s): "CKTOTAL", "CKMB", "CKMBINDEX", "TROPONINI" in the last 168 hours. BNP (last 3 results) No results for input(s): "PROBNP" in the last 8760 hours. HbA1C: No results for input(s): "HGBA1C" in the last 72 hours. CBG: No results for input(s): "GLUCAP" in the last 168 hours. Lipid Profile: No results for input(s): "CHOL", "HDL", "LDLCALC", "TRIG", "CHOLHDL", "LDLDIRECT" in the last 72 hours. Thyroid Function Tests: No results for input(s): "TSH", "T4TOTAL", "FREET4", "T3FREE", "THYROIDAB" in the last 72 hours. Anemia Panel: No results for input(s): "VITAMINB12", "FOLATE", "FERRITIN", "TIBC", "IRON", "RETICCTPCT" in the last 72 hours. Sepsis Labs: No results for input(s): "PROCALCITON", "LATICACIDVEN" in the last 168 hours.  No results found for this or any previous visit (from the past 240 hour(s)).       Radiology Studies: DG Knee Complete 4 Views Left  Result Date: 12/09/2022 CLINICAL DATA:  Pain after recent fall EXAM: LEFT KNEE - COMPLETE 4 VIEW COMPARISON:  None Available. FINDINGS: No evidence of fracture, dislocation, or joint effusion. No evidence of arthropathy or other focal bone abnormality. Soft tissues are unremarkable. IMPRESSION: Negative. Electronically Signed   By: Jacob Moores M.D.   On: 12/09/2022 14:47   DG Ankle Complete Left  Result Date: 12/09/2022 CLINICAL DATA:  Status post fall today. Lateral ankle pain and swelling. EXAM: LEFT ANKLE COMPLETE - 3+ VIEW COMPARISON:  None Available. FINDINGS: There is a comminuted acute fracture of the distal fibular diaphysis involving an approximate 5 cm length of the bone. There is up to approximately 3 mm diastasis of the fracture components. Up to approximately 2 mm cortical step-off at the lateral aspect of the fracture. There is a triangular cortical fragment measuring approximately 1.5 cm in length that is angled posteriorly, resulting in up to approximately 6  mm posterior step-off seen on lateral view and 3 mm medial cortical step-off seen on frontal view. The ankle mortise is symmetric and intact. Tiny plantar calcaneal heel spur. IMPRESSION: Acute comminuted fracture of the distal fibular diaphysis with up to 3 mm diastasis of the fracture components. Electronically Signed   By: Neita Garnet M.D.   On: 12/09/2022 14:31   DG Ankle Complete Right  Result Date: 12/09/2022 CLINICAL DATA:  Fall today.  Bilateral ankle pain and swelling. EXAM: RIGHT ANKLE - COMPLETE 3+ VIEW COMPARISON:  Right ankle radiographs 04/03/2012 FINDINGS: There is subtle predominately transverse mildly curvilinear lucency within the distal fibula approximately 6 mm proximal to the distal tip, an acute nondisplaced fracture. Mild lateral malleolar soft tissue swelling. The ankle mortise is symmetric and intact. Mild-to-moderate dorsal talonavicular degenerative osteophytosis is slightly worsened from prior. Interval healing of the prior nondisplaced proximal fifth metatarsal fracture seen on remote 2013 radiographs. IMPRESSION: Acute nondisplaced fracture of the distal fibula approximately 6 mm proximal to the distal tip. Electronically Signed   By: Neita Garnet M.D.   On: 12/09/2022 14:29        Scheduled Meds:  acetaminophen  500 mg Oral Q6H   ibuprofen  800 mg Oral QID   lidocaine  1 patch Transdermal Q24H   oxyCODONE  5 mg Oral Q4H   pantoprazole  40 mg Oral BID   Continuous Infusions:  methocarbamol (ROBAXIN) IV 500 mg (12/10/22 1040)     LOS: 0 days    Time spent: 35 minutes    Kade Rickels Hoover Brunette, DO [facility]  If 7PM-7AM, please contact night-coverage [email or web address] 12/10/2022, 2:22 PM

## 2022-12-10 NOTE — Progress Notes (Signed)
Patients blood pressure 160/102, heart rate 78. Patient complaining of pain 5/10 at back. Patient reported that she does not have back pain normally. Patient described it as shooting pain from back to chest, causing chest pain. Patient reported more back pain than chest pain. MD made aware of all medications patient had been given for pain, see MAR. MD Sherryll Burger made aware. New orders placed.

## 2022-12-10 NOTE — Consult Note (Signed)
Ortho care   Consult request  Dr. Maurilio Lovely  Diagnosis bilateral ankle fractures  Chief complaint bilateral ankle pain  History 52 year old female fell at her home mechanical fall after stepping in a hole fractured both of her ankles.  Complains of severe bilateral ankle pain since Dec 09, 2022.  Evaluation in the ER revealed only the isolated right and left ankle fractures.  Review of systems negative  Past Medical History:  Diagnosis Date   Anemia    Anxiety    Back pain, chronic    Depression    GERD (gastroesophageal reflux disease)    Hypertension    Thyroid disease    Past Surgical History:  Procedure Laterality Date   APPENDECTOMY     BIOPSY  02/06/2020   Procedure: BIOPSY;  Surgeon: Dolores Frame, MD;  Location: [facility];  Service: Gastroenterology;;  gastric    ESOPHAGOGASTRODUODENOSCOPY (EGD) WITH PROPOFOL N/A 02/06/2020   Procedure: ESOPHAGOGASTRODUODENOSCOPY (EGD) WITH PROPOFOL;  Surgeon: Dolores Frame, MD;  Location: [facility];  Service: Gastroenterology;  Laterality: N/A;  1015   TUBAL LIGATION     Family History  Problem Relation Age of Onset   Stroke Mother    Hypertension Mother    COPD Mother    Cancer Father        prostate cancer   Social History   Tobacco Use   Smoking status: Every Day    Packs/day: 0.25    Years: 20.00    Additional pack years: 0.00    Total pack years: 5.00    Types: Cigarettes   Smokeless tobacco: Never  Vaping Use   Vaping Use: Never used  Substance Use Topics   Alcohol use: Yes    Comment: occasionally drinks mixed drink   Drug use: No   Allergies  Allergen Reactions   Biaxin [Clarithromycin]     Sick on stomach   Current Meds  Medication Sig   Aspirin-Acetaminophen (GOODYS BODY PAIN PO) Take 1 packet by mouth as needed (pain).   fluticasone (FLONASE) 50 MCG/ACT nasal spray Place 2 sprays into both nostrils daily.   loratadine (CLARITIN) 10 MG tablet Take 10 mg by  mouth daily.   omeprazole (PRILOSEC) 40 MG capsule TAKE 1 CAPSULE(40 MG) BY MOUTH IN THE MORNING AND AT BEDTIME (Patient taking differently: Take 40 mg by mouth 2 (two) times daily.)    BP (!) 159/90 (BP Location: Right Arm)   Pulse 68   Temp 98.4 F (36.9 C) (Oral)   Resp 18   Ht 5\' 1"  (1.549 m)   Wt 90.7 kg   LMP 01/07/2020   SpO2 100%   BMI 37.79 kg/m   The patients BMI is 37.79 she is overweight.  She is otherwise normal appearance  Cardiovascular normal pulse perfusion without edema other than from the swelling from the ankle fractures compartments are soft  Neuro normal sensation  Psychiatric the patient is in a lot of pain at this moment and otherwise is appropriate awake alert oriented  Musculoskeletal her upper extremities are normal without fracture dislocation subluxation alignment normal normal muscle tone skin normal  Her lower extremities are splinted and I am not going to remove them in order to prevent any increase in pain  She is unable to weight-bear in terms of gait  Imaging the first images I review are the knee films left knee no fracture  The second images of the left ankle comminuted fibular high fracture most likely glee from a  pronation external rotation injury nondisplaced ankle mortise intact  The third set of images right ankle distal fibula fracture is nondisplaced ankle mortise intact  Assessment and plan 52 year old female relatively healthy bilateral ankle fractures both nondisplaced  Recommend nonoperative treatment for both  The patient is in a cam walker on the right and can be weightbearing as tolerated  The left ankle is splinted and should be nonweightbearing  I placed the patient on oxycodone, ibuprofen, Robaxin, Tylenol  I ordered a diet no surgery indicated at this time  Ordered physical therapy

## 2022-12-10 NOTE — Progress Notes (Signed)
Patient reporting nausea, indigestion and pain 9.5/10 to back and abdomen. Vital signs: T-98.4, P-68, R-19, O2-100% at room air, BP-195/115 patient moving a lot while checking BP. Manually checked BP was 162/86. Patient already received PRN for pain. Psychologist, prison and probation services at bedside. Patients breathing more labored after being adjusted in bed. MD Sherryll Burger made aware. New orders placed.

## 2022-12-10 NOTE — Progress Notes (Signed)
Patient reported severe pain to back 10/10. Patient grabbing at side rail and crying. Vital signs: T-97.5, P-64, R-40, BP-214/109, O2- 100% at room air. MD Sherryll Burger made aware. New orders placed by MD. Patient given PRN Dilaudid, see MAR.

## 2022-12-10 NOTE — Progress Notes (Signed)
   12/10/22 1330  Assess: MEWS Score  BP (!) 185/105  MAP (mmHg) 124  Pulse Rate 66  Assess: MEWS Score  MEWS Temp 0  MEWS Systolic 0  MEWS Pulse 0  MEWS RR 0  MEWS LOC 0  MEWS Score 0  MEWS Score Color Green  Assess: SIRS CRITERIA  SIRS Temperature  0  SIRS Pulse 0  SIRS Respirations  0  SIRS WBC 0  SIRS Score Sum  0

## 2022-12-10 NOTE — Progress Notes (Signed)
Transition of Care [facility]) - Inpatient Brief Assessment   Patient Details  Name: [name] MRN: [id number] Date of Birth: 11-13-1970  Transition of Care [facility]) CM/SW Contact:    Leitha Bleak, RN Phone Number: 12/10/2022, 12:10 PM   Clinical Narrative:  Patient admitted after a fall. Ortho consult complete, no surgery needed. PT eval pending.   Transition of Care Asessment: Insurance and Status: (P) Insurance coverage has been reviewed Patient has primary care physician: (P) No (PCP list added to AVS) Home environment has been reviewed: (P) Home with spouse Prior level of function:: (P) independent Prior/Current Home Services: (P) No current home services Social Determinants of Health Reivew: (P) SDOH reviewed no interventions necessary Readmission risk has been reviewed: (P) Yes Transition of care needs: (P) no transition of care needs at this time

## 2022-12-10 NOTE — Discharge Instructions (Signed)
  Providers Accepting New Patients in [place], [place]    [facility] [address]  [place], [place] [address] [phone] Accepts most insurances  [facility] [address] [place], [place] [address] [phone] Accepts most insurances  [facility] [address] [place], [place] [address]  [phone] Must meet requirements  [facility] [address] [place], [place] [address] [phone] Accepts most insurances  [facility] [address]  [place], [place] [address] [phone] Accepts most insurances  [facility] [address]   [place], [place]   [phone] Accepts most insurances  [facility])  [phone] [address]  [place], [place] [address] [phone] Accepts most insurances     [facility] [address]  [place], [place] [address] [phone] Accepts most insurances  [facility] [address] [place], [place] [address] [phone] option 1 Accepts Medicaid and [facility] [address]  [place], [place] [address] [phone] Accepts most insurances  [place], [place] [address] [place], [place] [address] [phone] Accepts most insurances  [facility] [address]  [place], [place] [address] [phone] Accepts most insurances  [facility] [phone] [address] [place], [place] [address] [phone] Accepts most insurances  [place], [place] [address] [place], [place] [address] [phone]  Accepts most insurances

## 2022-12-10 NOTE — Progress Notes (Signed)
PT Cancellation Note  Patient Details Name: Breaja Stennis MRN: [id number] DOB: 07/31/1970   Cancelled Treatment:    Reason Eval/Treat Not Completed: Medical issues which prohibited therapy; attempted PT evaluation 3 times today but patient with high pain levels despite medications prior to attempts. Also, patient with high BP contraindicated for exercise today with 214/109 earlier and with most recent attempt BP at 180/105 at rest. Will continue to check back.   2:31 PM, 12/10/22 Wyman Songster PT, DPT Physical Therapist at [facility]

## 2022-12-10 NOTE — Progress Notes (Signed)
   12/10/22 1223  Assess: MEWS Score  Temp (!) 97.5 F (36.4 C)  BP (!) 214/109  MAP (mmHg) 133  Pulse Rate 64  Resp (!) 40  SpO2 100 %  O2 Device Room Air  Assess: MEWS Score  MEWS Temp 0  MEWS Systolic 2  MEWS Pulse 0  MEWS RR 3  MEWS LOC 0  MEWS Score 5  MEWS Score Color Red  Assess: if the MEWS score is Yellow or Red  Were vital signs taken at a resting state? Yes  Focused Assessment Change from prior assessment (see assessment flowsheet)  Does the patient meet 2 or more of the SIRS criteria? Yes  Does the patient have a confirmed or suspected source of infection? No  MEWS guidelines implemented  Yes, red  Treat  MEWS Interventions Considered administering scheduled or prn medications/treatments as ordered  Take Vital Signs  Increase Vital Sign Frequency  Red: Q1hr x2, continue Q4hrs until patient remains green for 12hrs  Escalate  MEWS: Escalate Red: Discuss with charge nurse and notify provider. Consider notifying RRT. If remains red for 2 hours consider need for higher level of care  Notify: Charge Nurse/RN  Name of Charge Nurse/RN Notified Psychologist, prison and probation services  Provider Notification  Provider Name/Title Maurilio Lovely D, DO  Date Provider Notified 12/10/22  Time Provider Notified 1226  Method of Notification Page (Secure chat)  Notification Reason Other (Comment) (Red MEWs)  Provider response See new orders  Date of Provider Response 12/10/22  Time of Provider Response 1239  Assess: SIRS CRITERIA  SIRS Temperature  0  SIRS Pulse 0  SIRS Respirations  1  SIRS WBC 0  SIRS Score Sum  1

## 2022-12-11 DIAGNOSIS — S82401A Unspecified fracture of shaft of right fibula, initial encounter for closed fracture: Secondary | ICD-10-CM | POA: Diagnosis not present

## 2022-12-11 DIAGNOSIS — S82402A Unspecified fracture of shaft of left fibula, initial encounter for closed fracture: Secondary | ICD-10-CM | POA: Diagnosis not present

## 2022-12-11 LAB — BASIC METABOLIC PANEL
Anion gap: 9 (ref 5–15)
BUN: 17 mg/dL (ref 6–20)
CO2: 24 mmol/L (ref 22–32)
Calcium: 8.5 mg/dL — ABNORMAL LOW (ref 8.9–10.3)
Chloride: 103 mmol/L (ref 98–111)
Creatinine, Ser: 0.89 mg/dL (ref 0.44–1.00)
GFR, Estimated: >60 mL/min (ref >60)
Glucose, Bld: 94 mg/dL (ref 70–99)
Potassium: 3.8 mmol/L (ref 3.5–5.1)
Sodium: 136 mmol/L (ref 135–145)

## 2022-12-11 LAB — CBC
HCT: 41.3 % (ref 36.0–46.0)
Hemoglobin: 12.9 g/dL (ref 12.0–15.0)
MCH: 29.8 pg (ref 26.0–34.0)
MCHC: 31.2 g/dL (ref 30.0–36.0)
MCV: 95.4 fL (ref 80.0–100.0)
Platelets: 203 10*3/uL (ref 150–400)
RBC: 4.33 MIL/uL (ref 3.87–5.11)
RDW: 13.8 % (ref 11.5–15.5)
WBC: 5.2 10*3/uL (ref 4.0–10.5)
nRBC: 0 % (ref 0.0–0.2)

## 2022-12-11 LAB — MAGNESIUM: Magnesium: 1.8 mg/dL (ref 1.7–2.4)

## 2022-12-11 MED ORDER — LIDOCAINE 5 % EX PTCH: 1.0000 | MEDICATED_PATCH | CUTANEOUS | 0 refills | Status: AC

## 2022-12-11 MED ORDER — METHOCARBAMOL 500 MG PO TABS: 500.0000 mg | ORAL_TABLET | Freq: Four times a day (QID) | ORAL | 0 refills | Status: DC | PRN

## 2022-12-11 MED ORDER — IBUPROFEN 800 MG PO TABS: 800.0000 mg | ORAL_TABLET | Freq: Four times a day (QID) | ORAL | 0 refills | Status: AC

## 2022-12-11 MED ORDER — ACETAMINOPHEN 500 MG PO TABS: 500.0000 mg | ORAL_TABLET | Freq: Four times a day (QID) | ORAL | 0 refills | Status: AC | PRN

## 2022-12-11 MED ORDER — ONDANSETRON HCL 4 MG PO TABS: 4.0000 mg | ORAL_TABLET | Freq: Four times a day (QID) | ORAL | 0 refills | Status: DC | PRN

## 2022-12-11 MED ORDER — OXYCODONE HCL 5 MG PO TABS: 5.0000 mg | ORAL_TABLET | ORAL | 0 refills | Status: DC | PRN

## 2022-12-11 MED ORDER — METHOCARBAMOL 500 MG PO TABS: 500.0000 mg | ORAL_TABLET | Freq: Once | ORAL | Status: DC

## 2022-12-11 MED ORDER — POLYETHYLENE GLYCOL 3350 17 G PO PACK: 17.0000 g | PACK | Freq: Every day | ORAL | 0 refills | Status: DC | PRN

## 2022-12-11 NOTE — TOC Transition Note (Addendum)
Transition of Care [facility]) - CM/SW Discharge Note   Patient Details  Name: [name] MRN: [id number] Date of Birth: Mar 20, 1971  Transition of Care [facility]) CM/SW Contact:  Princella Ion, LCSW Phone Number: 12/11/2022, 12:24 PM   Clinical Narrative:    This CSW outreached to [employer] to request BSC 3-in-1, Wheelchair, and RW. Left HIPAA Compliant voicemail.   This CSW outreached to the following HH agencies:  [facility]: [facility]: Unable to Service. [facility]: Unable to service. [facility]: Unable to service.  [facility]:  [facility]: Unable to service.  [facility]: Unable to service  Addend @ 1:07 PM Discussed with pt at length the barrier to securing [facility] PT. Pt has verbalized that she still wishes to return home without HH PT. Informed pt that the DME agency is unable to deliver both a wheelchair and RW today but can do one or the other. Jasmine at [employer] reported the wheelchair could be delivered today and the RW could be delivered by Monday. Pt verbalized understanding and stated she feels confident she can manage going to the bathroom with the wheelchair. This CSW also informed the pt of her efforts to contact her spouse without success. Pt states she will update him. This CSW has confirmed wheelchair delivery to bedside and RW/ BSC 3-in-1 to be delivered to home by Monday. MD, PT, RN'S notified via secure chat. MD states pt will be d/c today. No further TOC needs.          Patient Goals and CMS Choice      Discharge Placement                         Discharge Plan and Services Additional resources added to the After Visit Summary for                                       Social Determinants of Health (SDOH) Interventions SDOH Screenings   Food Insecurity: No Food Insecurity (12/09/2022)  Housing: Low Risk  (12/09/2022)  Transportation Needs: No Transportation Needs (12/09/2022)  Utilities: Not At Risk (12/09/2022)  Tobacco Use: High Risk (12/09/2022)      Readmission Risk Interventions     No data to display

## 2022-12-11 NOTE — Evaluation (Signed)
Physical Therapy Evaluation Patient Details Name: [name] MRN: [id number] DOB: 03/07/1971 Today's Date: 12/11/2022  History of Present Illness  [name] is a 52 y.o. female with medical history significant for tobacco abuse, anxiety depression hypertension.  Patient presented to the ED with reports of a fall and subsequent bilateral ankle pain.  She reports she was working in her yard when she stepped into water on the floor made by tire marks, she twisted both ankles.  She is unable to ambulate.  She did not lose consciousness, she did not hit her head.  She otherwise has no other complaints apart from epigastric burning, from chronic GERD takes omeprazole and reports EGD in the past.  No melena or blood in stools,     ED Course: Stable vitals.  X-rays show-nondisplaced distal fibular fracture on the right, comminuted fracture of distal fibula on the left.  [facility] orthopedics consulted, recommended cam boot, and splint for now.  Will see in consult.   Clinical Impression  Patient demonstrates slow labored movement for sitting up at bedside requiring Mod assist for moving LLE due to increasing pain, poor tolerance for standing on right foot due to pain wearing CAM boot and unable to transfer to chair using RW.  Patient required partial stand pivot on right foot and use of armrest of chair to transfer over to chair and tolerated sitting up after therapy - nurse aware.  Patient will benefit from continued skilled physical therapy in hospital and recommended venue below to increase strength, balance, endurance for safe ADLs and gait.         Recommendations for follow up therapy are one component of a multi-disciplinary discharge planning process, led by the attending physician.  Recommendations may be updated based on patient status, additional functional criteria and insurance authorization.  Follow Up Recommendations       Assistance Recommended at Discharge Set up  Supervision/Assistance  Patient can return home with the following  A lot of help with bathing/dressing/bathroom;A lot of help with walking and/or transfers;Help with stairs or ramp for entrance;Assistance with cooking/housework    Equipment Recommendations Rolling walker (2 wheels);Wheelchair cushion (measurements PT);Wheelchair (measurements PT);BSC/3in1  Recommendations for Other Services       Functional Status Assessment Patient has had a recent decline in their functional status and demonstrates the ability to make significant improvements in function in a reasonable and predictable amount of time.     Precautions / Restrictions Precautions Precautions: Fall Required Braces or Orthoses: Other Brace Other Brace: CAM Boot RLE, WBAT Restrictions Weight Bearing Restrictions: Yes RLE Weight Bearing: Weight bearing as tolerated LLE Weight Bearing: Non weight bearing Other Position/Activity Restrictions: RLE CAM boot, WBAT      Mobility  Bed Mobility Overal bed mobility: Needs Assistance Bed Mobility: Supine to Sit     Supine to sit: Min assist, Mod assist     General bed mobility comments: increased time, labored movement, required assist to move LLE due to increasing pain    Transfers Overall transfer level: Needs assistance   Transfers: Sit to/from Stand, Bed to chair/wheelchair/BSC Sit to Stand: Mod assist Stand pivot transfers: Mod assist         General transfer comment: unable to full stand using RW due to increasing right ankle pain, required stand pivot on RLE and use of armrest of chair    Ambulation/Gait                  Stairs  Wheelchair Mobility    Modified Rankin (Stroke Patients Only)       Balance Overall balance assessment: Needs assistance Sitting-balance support: Feet supported, No upper extremity supported Sitting balance-Leahy Scale: Good Sitting balance - Comments: seated at EOB   Standing balance support:  Reliant on assistive device for balance, During functional activity, Bilateral upper extremity supported Standing balance-Leahy Scale: Poor Standing balance comment: using RW                             Pertinent Vitals/Pain Pain Assessment Pain Assessment: 0-10 Pain Score: 4  Pain Location: bilateral ankles, mostly left Pain Descriptors / Indicators: Grimacing, Guarding, Discomfort, Sharp Pain Intervention(s): Limited activity within patient's tolerance, Monitored during session, Repositioned, Premedicated before session    Home Living Family/patient expects to be discharged to:: Private residence Living Arrangements: Spouse/significant other Available Help at Discharge: Family;Available 24 hours/day Type of Home: House Home Access: Stairs to enter Entrance Stairs-Rails: None Entrance Stairs-Number of Steps: 2   Home Layout: One level Home Equipment: None      Prior Function Prior Level of Function : Independent/Modified Independent;Driving             Mobility Comments: Tourist information centre manager, drives, does own shopping ADLs Comments: Independent     Hand Dominance        Extremity/Trunk Assessment   Upper Extremity Assessment Upper Extremity Assessment: Overall WFL for tasks assessed    Lower Extremity Assessment Lower Extremity Assessment: Generalized weakness;RLE deficits/detail;LLE deficits/detail RLE Deficits / Details: grossly 4/5 RLE: Unable to fully assess due to pain;Unable to fully assess due to immobilization (wearing CAM boot) RLE Sensation: WNL RLE Coordination: WNL LLE Deficits / Details: grossly -4/5 except left ankle/foot not tested due to pain LLE: Unable to fully assess due to pain LLE Sensation: WNL LLE Coordination: WNL    Cervical / Trunk Assessment Cervical / Trunk Assessment: Normal  Communication   Communication: No difficulties  Cognition Arousal/Alertness: Awake/alert Behavior During Therapy: WFL for tasks  assessed/performed Overall Cognitive Status: Within Functional Limits for tasks assessed                                          General Comments      Exercises     Assessment/Plan    PT Assessment Patient needs continued PT services  PT Problem List Decreased strength;Decreased activity tolerance;Decreased balance;Decreased mobility       PT Treatment Interventions DME instruction;Gait training;Functional mobility training;Therapeutic activities;Therapeutic exercise;Patient/family education;Wheelchair mobility training;Balance training    PT Goals (Current goals can be found in the Care Plan section)  Acute Rehab PT Goals Patient Stated Goal: return home with family to assist PT Goal Formulation: With patient Time For Goal Achievement: 12/14/22 Potential to Achieve Goals: Good    Frequency Min 3X/week     Co-evaluation               AM-PAC PT "6 Clicks" Mobility  Outcome Measure Help needed turning from your back to your side while in a flat bed without using bedrails?: A Little Help needed moving from lying on your back to sitting on the side of a flat bed without using bedrails?: A Lot Help needed moving to and from a bed to a chair (including a wheelchair)?: A Lot Help needed standing up from a chair using your arms (e.g.,  wheelchair or bedside chair)?: A Lot Help needed to walk in hospital room?: Total Help needed climbing 3-5 steps with a railing? : Total 6 Click Score: 11    End of Session   Activity Tolerance: Patient tolerated treatment well;Patient limited by fatigue Patient left: in chair;with call bell/phone within reach Nurse Communication: Mobility status PT Visit Diagnosis: Unsteadiness on feet (R26.81);Other abnormalities of gait and mobility (R26.89);Muscle weakness (generalized) (M62.81)    Time: 1002-1030 PT Time Calculation (min) (ACUTE ONLY): 28 min   Charges:   PT Evaluation $PT Eval Moderate Complexity: 1 Mod PT  Treatments $Therapeutic Activity: 23-37 mins        10:53 AM, 12/11/22 Ocie Bob, MPT Physical Therapist with [facility] [phone] office [phone] mobile phone

## 2022-12-11 NOTE — Progress Notes (Signed)
Patient suffers from bilateral ankle fractrues with WBAT on right foot wearing CAM boot, NWB LLE which impairs their ability to perform daily activities like transfers, walking, bathing, dressing, and toileting in the home.  A walker will not resolve issue with performing activities of daily living. A wheelchair will allow patient to safely perform daily activities. Patient can safely propel the wheelchair in the home or has a caregiver who can provide assistance. Length of need 6 months .  Patient also requires a bedside commode due to significant weakness and inability to transfer self to toilet adequately without the use of such equipment.

## 2022-12-11 NOTE — Plan of Care (Signed)
  Problem: Acute Rehab PT Goals(only PT should resolve) Goal: Pt Will Go Supine/Side To Sit Outcome: Progressing Flowsheets (Taken 12/11/2022 1055) Pt will go Supine/Side to Sit:  with minimal assist  with min guard assist Goal: Patient Will Transfer Sit To/From Stand Outcome: Progressing Flowsheets (Taken 12/11/2022 1055) Patient will transfer sit to/from stand: with minimal assist Goal: Pt Will Transfer Bed To Chair/Chair To Bed Outcome: Progressing Flowsheets (Taken 12/11/2022 1055) Pt will Transfer Bed to Chair/Chair to Bed: with min assist Goal: Pt Will Ambulate Outcome: Progressing Flowsheets (Taken 12/11/2022 1055) Pt will Ambulate:  10 feet  with moderate assist  with rolling walker   10:55 AM, 12/11/22 Ocie Bob, MPT Physical Therapist with [facility] [phone] office [phone] mobile phone

## 2022-12-11 NOTE — Discharge Summary (Signed)
Physician Discharge Summary  [name] ZOX:[id number] DOB: 04-09-1971 DOA: 12/09/2022  PCP: Patient, No Pcp Per  Admit date: 12/09/2022  Discharge date: 12/11/2022  Admitted From:Home  Disposition:  Home  Recommendations for Outpatient Follow-up:  Follow up with PCP in 1-2 weeks Follow-up with orthopedics, Dr. Romeo Apple as recommended with referral sent Continue on pain medications as prescribed below  Home Health: Yes with PT  Equipment/Devices: Rolling walker, wheelchair, 3 in 1  Discharge Condition:Stable  CODE STATUS: Full  Diet recommendation: Heart Healthy  Brief/Interim Summary: [name] is a 52 y.o. female with medical history significant for tobacco abuse, anxiety depression hypertension. Patient presented to the ED with reports of a fall and subsequent bilateral ankle pain.  She was noted to have bilateral fibula fractures and orthopedics is recommending conservative management.  She was noted to have significant issues with back pain likely related to muscle spasm and had multiple x-rays done with no findings of any acute abnormalities.  Her pain is better controlled this morning and she has been seen by physical therapy with recommendations for home health services as well as home equipment.  She will need outpatient follow-up with orthopedics as recommended and will have home equipment sent to assist her.  No other acute events or concerns noted.  Discharge Diagnoses:  Principal Problem:   Bilateral fibular fractures Active Problems:   Chronic GERD   Hypokalemia   Ankle fracture  Principal discharge diagnosis: Bilateral closed fibular fractures with severe back pain.  Discharge Instructions  Discharge Instructions     Ambulatory referral to Orthopedics   Complete by: As directed    Diet - low sodium heart healthy   Complete by: As directed    For home use only DME 3 n 1   Complete by: As directed    For home use only DME 4 wheeled rolling walker with  seat   Complete by: As directed    Patient needs a walker to treat with the following condition: Weakness   For home use only DME standard manual wheelchair with seat cushion   Complete by: As directed    Patient suffers from weakness which impairs their ability to perform daily activities like dressing in the home.  A cane will not resolve issue with performing activities of daily living. A wheelchair will allow patient to safely perform daily activities. Patient can safely propel the wheelchair in the home or has a caregiver who can provide assistance. Length of need 6 months . Accessories: elevating leg rests (ELRs), wheel locks, extensions and anti-tippers.   Increase activity slowly   Complete by: As directed       Allergies as of 12/11/2022       Reactions   Biaxin [clarithromycin]    Sick on stomach        Medication List     STOP taking these medications    GOODYS BODY PAIN PO       TAKE these medications    acetaminophen 500 MG tablet Commonly known as: TYLENOL Take 1 tablet (500 mg total) by mouth every 6 (six) hours as needed for mild pain, moderate pain, fever or headache.   fluticasone 50 MCG/ACT nasal spray Commonly known as: FLONASE Place 2 sprays into both nostrils daily.   ibuprofen 800 MG tablet Commonly known as: ADVIL Take 1 tablet (800 mg total) by mouth 4 (four) times daily for 10 days.   lidocaine 5 % Commonly known as: LIDODERM Place 1 patch onto the skin  daily. Remove & Discard patch within 12 hours or as directed by MD Start taking on: December 12, 2022   loratadine 10 MG tablet Commonly known as: CLARITIN Take 10 mg by mouth daily.   methocarbamol 500 MG tablet Commonly known as: ROBAXIN Take 1 tablet (500 mg total) by mouth every 6 (six) hours as needed for muscle spasms.   omeprazole 40 MG capsule Commonly known as: PRILOSEC TAKE 1 CAPSULE(40 MG) BY MOUTH IN THE MORNING AND AT BEDTIME What changed: See the new instructions.    ondansetron 4 MG tablet Commonly known as: ZOFRAN Take 1 tablet (4 mg total) by mouth every 6 (six) hours as needed for nausea.   oxyCODONE 5 MG immediate release tablet Commonly known as: Oxy IR/ROXICODONE Take 1 tablet (5 mg total) by mouth every 4 (four) hours as needed for severe pain or breakthrough pain.   polyethylene glycol 17 g packet Commonly known as: MIRALAX / GLYCOLAX Take 17 g by mouth daily as needed for mild constipation.               Durable Medical Equipment  (From admission, onward)           Start     Ordered   12/11/22 0000  For home use only DME standard manual wheelchair with seat cushion       Comments: Patient suffers from weakness which impairs their ability to perform daily activities like dressing in the home.  A cane will not resolve issue with performing activities of daily living. A wheelchair will allow patient to safely perform daily activities. Patient can safely propel the wheelchair in the home or has a caregiver who can provide assistance. Length of need 6 months . Accessories: elevating leg rests (ELRs), wheel locks, extensions and anti-tippers.   12/11/22 1045   12/11/22 0000  For home use only DME 4 wheeled rolling walker with seat       Question:  Patient needs a walker to treat with the following condition  Answer:  Weakness   12/11/22 1045   12/11/22 0000  For home use only DME 3 n 1        12/11/22 1045            Follow-up Information     Vickki Hearing, MD. Go to.   Specialties: Orthopedic Surgery, Radiology Contact information: [address] [place] [address] [phone]                Allergies  Allergen Reactions   Biaxin [Clarithromycin]     Sick on stomach    Consultations: Orthopedics   Procedures/Studies: DG Thoracic Spine 2 View  Result Date: 12/10/2022 CLINICAL DATA:  Back pain EXAM: THORACIC SPINE 2 VIEWS COMPARISON:  None Available. FINDINGS: Lateral examination is  limited with obscuration of the superior thoracic spine by overlying soft tissue. Lumbosacral junction is excluded from view. No acute fracture or listhesis involving the visualized midthoracic spine. Vertebral body height and intervertebral disc heights are preserved. Paraspinal soft tissues are unremarkable. IMPRESSION: 1. Markedly limited examination. No acute fracture or listhesis involving the visualized midthoracic spine. Electronically Signed   By: Helyn Numbers M.D.   On: 12/10/2022 18:26   DG Ribs Unilateral W/Chest Right  Result Date: 12/10/2022 CLINICAL DATA:  Right rib pain EXAM: RIGHT RIBS AND CHEST - 3+ VIEW COMPARISON:  None Available. FINDINGS: No fracture or other bone lesions are seen involving the ribs. There is no evidence of pneumothorax or pleural  effusion. Both lungs are clear. Heart size and mediastinal contours are within normal limits. IMPRESSION: Negative. Electronically Signed   By: Helyn Numbers M.D.   On: 12/10/2022 18:25   DG Ribs Unilateral Left  Result Date: 12/10/2022 CLINICAL DATA:  Left rib pain EXAM: LEFT RIBS - 2 VIEW COMPARISON:  None Available. FINDINGS: No fracture or other bone lesions are seen involving the ribs. IMPRESSION: Negative. Electronically Signed   By: Helyn Numbers M.D.   On: 12/10/2022 18:24   DG Lumbar Spine 2-3 Views  Result Date: 12/10/2022 CLINICAL DATA:  Back pain EXAM: LUMBAR SPINE - 2-3 VIEW COMPARISON:  None Available. FINDINGS: Lateral examination is essentially nondiagnostic due to underpenetration. Frontal radiograph of the lumbar spine demonstrates normal alignment. No definite fracture. Paraspinal soft tissues are unremarkable. IMPRESSION: 1. Markedly limited examination due to underpenetration. No definite acute abnormality. Electronically Signed   By: Helyn Numbers M.D.   On: 12/10/2022 18:24   DG Knee Complete 4 Views Left  Result Date: 12/09/2022 CLINICAL DATA:  Pain after recent fall EXAM: LEFT KNEE - COMPLETE 4 VIEW  COMPARISON:  None Available. FINDINGS: No evidence of fracture, dislocation, or joint effusion. No evidence of arthropathy or other focal bone abnormality. Soft tissues are unremarkable. IMPRESSION: Negative. Electronically Signed   By: Jacob Moores M.D.   On: 12/09/2022 14:47   DG Ankle Complete Left  Result Date: 12/09/2022 CLINICAL DATA:  Status post fall today. Lateral ankle pain and swelling. EXAM: LEFT ANKLE COMPLETE - 3+ VIEW COMPARISON:  None Available. FINDINGS: There is a comminuted acute fracture of the distal fibular diaphysis involving an approximate 5 cm length of the bone. There is up to approximately 3 mm diastasis of the fracture components. Up to approximately 2 mm cortical step-off at the lateral aspect of the fracture. There is a triangular cortical fragment measuring approximately 1.5 cm in length that is angled posteriorly, resulting in up to approximately 6 mm posterior step-off seen on lateral view and 3 mm medial cortical step-off seen on frontal view. The ankle mortise is symmetric and intact. Tiny plantar calcaneal heel spur. IMPRESSION: Acute comminuted fracture of the distal fibular diaphysis with up to 3 mm diastasis of the fracture components. Electronically Signed   By: Neita Garnet M.D.   On: 12/09/2022 14:31   DG Ankle Complete Right  Result Date: 12/09/2022 CLINICAL DATA:  Fall today.  Bilateral ankle pain and swelling. EXAM: RIGHT ANKLE - COMPLETE 3+ VIEW COMPARISON:  Right ankle radiographs 04/03/2012 FINDINGS: There is subtle predominately transverse mildly curvilinear lucency within the distal fibula approximately 6 mm proximal to the distal tip, an acute nondisplaced fracture. Mild lateral malleolar soft tissue swelling. The ankle mortise is symmetric and intact. Mild-to-moderate dorsal talonavicular degenerative osteophytosis is slightly worsened from prior. Interval healing of the prior nondisplaced proximal fifth metatarsal fracture seen on remote 2013  radiographs. IMPRESSION: Acute nondisplaced fracture of the distal fibula approximately 6 mm proximal to the distal tip. Electronically Signed   By: Neita Garnet M.D.   On: 12/09/2022 14:29     Discharge Exam: Vitals:   12/10/22 2239 12/11/22 0254  BP: 131/79 (!) 115/92  Pulse: 95 71  Resp: 20 18  Temp: 98 F (36.7 C) 98.1 F (36.7 C)  SpO2: 97% 100%   Vitals:   12/10/22 1730 12/10/22 1748 12/10/22 2239 12/11/22 0254  BP: (!) 195/115 (!) 162/86 131/79 (!) 115/92  Pulse: 68  95 71  Resp: 19  20 18   Temp: 98.4  F (36.9 C)  98 F (36.7 C) 98.1 F (36.7 C)  TempSrc: Oral  Oral Oral  SpO2: 100%  97% 100%  Weight:      Height:        General: Pt is alert, awake, not in acute distress Cardiovascular: RRR, S1/S2 +, no rubs, no gallops Respiratory: CTA bilaterally, no wheezing, no rhonchi Abdominal: Soft, NT, ND, bowel sounds + Extremities: no edema, no cyanosis, currently in cam boot and splint    The results of significant diagnostics from this hospitalization (including imaging, microbiology, ancillary and laboratory) are listed below for reference.     Microbiology: No results found for this or any previous visit (from the past 240 hour(s)).   Labs: BNP (last 3 results) No results for input(s): "BNP" in the last 8760 hours. Basic Metabolic Panel: Recent Labs  Lab 12/09/22 1714 12/10/22 0433 12/11/22 0434  NA 138 139 136  K 3.3* 4.2 3.8  CL 104 106 103  CO2 26 25 24   GLUCOSE 108* 88 94  BUN 18 18 17   CREATININE 0.82 0.88 0.89  CALCIUM 9.1 9.0 8.5*  MG  --   --  1.8   Liver Function Tests: No results for input(s): "AST", "ALT", "ALKPHOS", "BILITOT", "PROT", "ALBUMIN" in the last 168 hours. No results for input(s): "LIPASE", "AMYLASE" in the last 168 hours. No results for input(s): "AMMONIA" in the last 168 hours. CBC: Recent Labs  Lab 12/09/22 1714 12/10/22 0433 12/11/22 0434  WBC 6.9 6.0 5.2  HGB 12.8 12.6 12.9  HCT 39.2 39.4 41.3  MCV 93.1 94.9  95.4  PLT 210 204 203   Cardiac Enzymes: No results for input(s): "CKTOTAL", "CKMB", "CKMBINDEX", "TROPONINI" in the last 168 hours. BNP: Invalid input(s): "POCBNP" CBG: No results for input(s): "GLUCAP" in the last 168 hours. D-Dimer No results for input(s): "DDIMER" in the last 72 hours. Hgb A1c No results for input(s): "HGBA1C" in the last 72 hours. Lipid Profile No results for input(s): "CHOL", "HDL", "LDLCALC", "TRIG", "CHOLHDL", "LDLDIRECT" in the last 72 hours. Thyroid function studies No results for input(s): "TSH", "T4TOTAL", "T3FREE", "THYROIDAB" in the last 72 hours.  Invalid input(s): "FREET3" Anemia work up No results for input(s): "VITAMINB12", "FOLATE", "FERRITIN", "TIBC", "IRON", "RETICCTPCT" in the last 72 hours. Urinalysis    Component Value Date/Time   COLORURINE STRAW (A) 11/08/2017 1600   APPEARANCEUR HAZY (A) 11/08/2017 1600   LABSPEC 1.005 11/08/2017 1600   PHURINE 7.0 11/08/2017 1600   GLUCOSEU NEGATIVE 11/08/2017 1600   HGBUR NEGATIVE 11/08/2017 1600   BILIRUBINUR NEGATIVE 11/08/2017 1600   KETONESUR NEGATIVE 11/08/2017 1600   PROTEINUR NEGATIVE 11/08/2017 1600   UROBILINOGEN 0.2 12/10/2013 1652   NITRITE NEGATIVE 11/08/2017 1600   LEUKOCYTESUR TRACE (A) 11/08/2017 1600   Sepsis Labs Recent Labs  Lab 12/09/22 1714 12/10/22 0433 12/11/22 0434  WBC 6.9 6.0 5.2   Microbiology No results found for this or any previous visit (from the past 240 hour(s)).   Time coordinating discharge: 35 minutes  SIGNED:   Erick Blinks, DO [facility] 12/11/2022, 10:46 AM  If 7PM-7AM, please contact night-coverage [email or web address]

## 2022-12-11 NOTE — Progress Notes (Addendum)
Patient discharged home today, transported home by family. Discharge paperwork went over with patient, patient verbalized understanding. Belongings sent home with patient. [employer] delivered patients wheelchair, which was sent home with patient.

## 2022-12-15 ENCOUNTER — Telehealth: Payer: Self-pay | Admitting: Orthopedic Surgery

## 2022-12-15 NOTE — Telephone Encounter (Signed)
Dr. Mort Sawyers pt - pt called today and scheduled a consult f/u for 12/20/22.  She stated that the pain meds (Oxycodone) given at [facility] aren't helping and she will run out tonight.  She is requesting something different to be sent to [facility]. [phone]

## 2022-12-15 NOTE — Telephone Encounter (Signed)
I called her to advise left message for her to call me back

## 2022-12-17 ENCOUNTER — Telehealth: Payer: Self-pay | Admitting: Orthopedic Surgery

## 2022-12-17 NOTE — Telephone Encounter (Signed)
Returned the patient's call, she stated that she requested meds and nothing had been called in.  I lvm with Dr. Mort Sawyers instructions.

## 2022-12-20 ENCOUNTER — Ambulatory Visit: Payer: Medicaid Other | Admitting: Orthopedic Surgery

## 2022-12-23 ENCOUNTER — Ambulatory Visit: Payer: Medicaid Other | Admitting: Orthopedic Surgery

## 2024-02-15 ENCOUNTER — Emergency Department (HOSPITAL_COMMUNITY)

## 2024-02-15 ENCOUNTER — Encounter (HOSPITAL_COMMUNITY): Payer: Self-pay

## 2024-02-15 ENCOUNTER — Other Ambulatory Visit: Payer: Self-pay

## 2024-02-15 ENCOUNTER — Emergency Department (HOSPITAL_COMMUNITY)
Admission: EM | Admit: 2024-02-15 | Discharge: 2024-02-15 | Discharge status: Against Medical Advice | Attending: Emergency Medicine | Admitting: Emergency Medicine

## 2024-02-15 DIAGNOSIS — R61 Generalized hyperhidrosis: Secondary | ICD-10-CM | POA: Diagnosis not present

## 2024-02-15 DIAGNOSIS — Z5321 Procedure and treatment not carried out due to patient leaving prior to being seen by health care provider: Secondary | ICD-10-CM | POA: Insufficient documentation

## 2024-02-15 DIAGNOSIS — R0789 Other chest pain: Secondary | ICD-10-CM | POA: Insufficient documentation

## 2024-02-15 LAB — BASIC METABOLIC PANEL WITH GFR
Anion gap: 11 (ref 5–15)
BUN: 15 mg/dL (ref 6–20)
CO2: 25 mmol/L (ref 22–32)
Calcium: 9.4 mg/dL (ref 8.9–10.3)
Chloride: 101 mmol/L (ref 98–111)
Creatinine, Ser: 0.78 mg/dL (ref 0.44–1.00)
GFR, Estimated: >60 mL/min (ref >60)
Glucose, Bld: 107 mg/dL — ABNORMAL HIGH (ref 70–99)
Potassium: 4.3 mmol/L (ref 3.5–5.1)
Sodium: 137 mmol/L (ref 135–145)

## 2024-02-15 LAB — CBC
HCT: 46.7 % — ABNORMAL HIGH (ref 36.0–46.0)
Hemoglobin: 15.2 g/dL — ABNORMAL HIGH (ref 12.0–15.0)
MCH: 30.9 pg (ref 26.0–34.0)
MCHC: 32.5 g/dL (ref 30.0–36.0)
MCV: 94.9 fL (ref 80.0–100.0)
Platelets: 232 K/uL (ref 150–400)
RBC: 4.92 MIL/uL (ref 3.87–5.11)
RDW: 13.2 % (ref 11.5–15.5)
WBC: 5.6 K/uL (ref 4.0–10.5)
nRBC: 0 % (ref 0.0–0.2)

## 2024-02-15 LAB — TROPONIN I (HIGH SENSITIVITY): Troponin I (High Sensitivity): 3 ng/L (ref <18)

## 2024-02-15 NOTE — ED Triage Notes (Signed)
 Patient from home for chest pain, diaphoresis, and feeling out of it for a couple of days. Patient denies any cough, congestion, or fevers. Upon arrival to ER, patient is alert and oriented, ambu

## 2024-02-16 ENCOUNTER — Ambulatory Visit
Admission: EM | Admit: 2024-02-16 | Discharge: 2024-02-16 | Disposition: A | Discharge status: Home / Self Care | Attending: Nurse Practitioner | Admitting: Nurse Practitioner

## 2024-02-16 ENCOUNTER — Emergency Department (HOSPITAL_COMMUNITY)

## 2024-02-16 ENCOUNTER — Encounter: Payer: Self-pay | Admitting: Emergency Medicine

## 2024-02-16 ENCOUNTER — Other Ambulatory Visit: Payer: Self-pay

## 2024-02-16 ENCOUNTER — Emergency Department (HOSPITAL_COMMUNITY)
Admission: EM | Admit: 2024-02-16 | Discharge: 2024-02-16 | Disposition: A | Discharge status: Home / Self Care | Source: Ambulatory Visit | Attending: Emergency Medicine | Admitting: Emergency Medicine

## 2024-02-16 ENCOUNTER — Encounter (HOSPITAL_COMMUNITY): Payer: Self-pay | Admitting: *Deleted

## 2024-02-16 DIAGNOSIS — R519 Headache, unspecified: Secondary | ICD-10-CM | POA: Diagnosis present

## 2024-02-16 DIAGNOSIS — R42 Dizziness and giddiness: Secondary | ICD-10-CM | POA: Diagnosis not present

## 2024-02-16 DIAGNOSIS — R0789 Other chest pain: Secondary | ICD-10-CM | POA: Diagnosis not present

## 2024-02-16 DIAGNOSIS — R110 Nausea: Secondary | ICD-10-CM | POA: Diagnosis not present

## 2024-02-16 DIAGNOSIS — R0602 Shortness of breath: Secondary | ICD-10-CM | POA: Insufficient documentation

## 2024-02-16 DIAGNOSIS — I10 Essential (primary) hypertension: Secondary | ICD-10-CM | POA: Insufficient documentation

## 2024-02-16 DIAGNOSIS — Z79899 Other long term (current) drug therapy: Secondary | ICD-10-CM | POA: Diagnosis not present

## 2024-02-16 DIAGNOSIS — R079 Chest pain, unspecified: Secondary | ICD-10-CM

## 2024-02-16 DIAGNOSIS — R5383 Other fatigue: Secondary | ICD-10-CM

## 2024-02-16 DIAGNOSIS — R0600 Dyspnea, unspecified: Secondary | ICD-10-CM

## 2024-02-16 LAB — BASIC METABOLIC PANEL WITH GFR
Anion gap: 10 (ref 5–15)
BUN: 19 mg/dL (ref 6–20)
CO2: 28 mmol/L (ref 22–32)
Calcium: 9.4 mg/dL (ref 8.9–10.3)
Chloride: 104 mmol/L (ref 98–111)
Creatinine, Ser: 0.91 mg/dL (ref 0.44–1.00)
GFR, Estimated: >60 mL/min (ref >60)
Glucose, Bld: 122 mg/dL — ABNORMAL HIGH (ref 70–99)
Potassium: 4.1 mmol/L (ref 3.5–5.1)
Sodium: 142 mmol/L (ref 135–145)

## 2024-02-16 LAB — CBC
HCT: 45.7 % (ref 36.0–46.0)
Hemoglobin: 14.9 g/dL (ref 12.0–15.0)
MCH: 31.5 pg (ref 26.0–34.0)
MCHC: 32.6 g/dL (ref 30.0–36.0)
MCV: 96.6 fL (ref 80.0–100.0)
Platelets: 230 K/uL (ref 150–400)
RBC: 4.73 MIL/uL (ref 3.87–5.11)
RDW: 13.3 % (ref 11.5–15.5)
WBC: 5.9 K/uL (ref 4.0–10.5)
nRBC: 0 % (ref 0.0–0.2)

## 2024-02-16 LAB — TROPONIN I (HIGH SENSITIVITY): Troponin I (High Sensitivity): 3 ng/L (ref <18)

## 2024-02-16 LAB — D-DIMER, QUANTITATIVE: D-Dimer, Quant: 0.52 ug{FEU}/mL — ABNORMAL HIGH (ref 0.00–0.50)

## 2024-02-16 MED ORDER — ACETAMINOPHEN 325 MG PO TABS
650.0000 mg | ORAL_TABLET | Freq: Once | ORAL | Status: AC
  Administered 2024-02-16: 650 mg via ORAL
  Filled 2024-02-16: qty 2

## 2024-02-16 MED ORDER — MORPHINE SULFATE (PF) 4 MG/ML IV SOLN
4.0000 mg | Freq: Once | INTRAVENOUS | Status: AC
  Administered 2024-02-16: 4 mg via INTRAVENOUS
  Filled 2024-02-16: qty 1

## 2024-02-16 MED ORDER — METOCLOPRAMIDE HCL 5 MG/ML IJ SOLN
10.0000 mg | Freq: Once | INTRAMUSCULAR | Status: AC
  Administered 2024-02-16: 10 mg via INTRAVENOUS
  Filled 2024-02-16: qty 2

## 2024-02-16 MED ORDER — LISINOPRIL 10 MG PO TABS: 10.0000 mg | ORAL_TABLET | Freq: Every day | ORAL | 0 refills | Status: AC

## 2024-02-16 MED ORDER — METOPROLOL TARTRATE 5 MG/5ML IV SOLN
5.0000 mg | Freq: Once | INTRAVENOUS | Status: AC
Start: 2024-02-16 — End: 2024-02-16
  Administered 2024-02-16: 5 mg via INTRAVENOUS
  Filled 2024-02-16: qty 5

## 2024-02-16 MED ORDER — ONDANSETRON HCL 4 MG/2ML IJ SOLN
4.0000 mg | Freq: Once | INTRAMUSCULAR | Status: AC
  Administered 2024-02-16: 4 mg via INTRAVENOUS
  Filled 2024-02-16: qty 2

## 2024-02-16 MED ORDER — ALBUTEROL SULFATE HFA 108 (90 BASE) MCG/ACT IN AERS: 1.0000 | INHALATION_SPRAY | Freq: Four times a day (QID) | RESPIRATORY_TRACT | 0 refills | Status: AC | PRN

## 2024-02-16 MED ORDER — IOHEXOL 350 MG/ML SOLN
75.0000 mL | Freq: Once | INTRAVENOUS | Status: AC | PRN
  Administered 2024-02-16: 75 mL via INTRAVENOUS

## 2024-02-16 MED ORDER — HYDRALAZINE HCL 20 MG/ML IJ SOLN
10.0000 mg | Freq: Once | INTRAMUSCULAR | Status: AC
  Administered 2024-02-16: 10 mg via INTRAVENOUS
  Filled 2024-02-16: qty 1
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
## 2024-02-16 NOTE — ED Notes (Addendum)
 Pt stated, I just feel blah. My chest and head still hurt. I keep sweating. I'm nauseous too.   VS and cardiac monitor WNL at this time. Pt breathing is WNL as well on RA. EDP notified

## 2024-02-16 NOTE — ED Notes (Signed)
 Patient is being discharged from the Urgent Care and sent to the Emergency Department via private vehicle . Per NP, patient is in need of higher level of care due to chest pain. Patient is aware and verbalizes understanding of plan of care.  Vitals:   02/16/24 1655 02/16/24 1725  BP: (!) 179/115 (!) 153/110  Pulse:    Resp:    Temp:    SpO2:

## 2024-02-16 NOTE — Discharge Instructions (Signed)
 All of your lab tests, x-rays and CT scan and EKG are reassuring today, there is no obvious source of your symptoms, however your blood pressure is elevated and is important that you not run out of your blood pressure medication.  I have prescribed you a 30-day supply of lisinopril .  It will be important for you to establish care with a primary MD and I am given you some suggestions for obtaining a primary doctor.  I have also prescribed you an albuterol  inhaler, although you are not wheezing on today's exam, sometimes this does help with breathing symptoms.

## 2024-02-16 NOTE — ED Triage Notes (Signed)
 Feels fatigued, dizzy, nausea, chest pain x 3 days.  Was seen in ED at [facility] last night, but had to leave before visit completed due to transportation.

## 2024-02-16 NOTE — Discharge Instructions (Addendum)
 Go to the emergency department for further evaluation.

## 2024-02-16 NOTE — ED Provider Notes (Signed)
 [name] EMERGENCY DEPARTMENT AT [facility] Provider Note   CSN: [id number] Arrival date & time: 02/16/24  1744     Patient presents with: Shortness of Breath   [name] is a 53 y.o. female with a prior history including hypertension but not currently treated as she does not have a PCP, also history of thyroid  disease presenting with a 4-day history of intermittent right-sided chest pain along with generalized headache, lightheadedness nausea, shortness of breath with intermittent episodes of difficulty catching her breath when ambulating.  Denies pleuritic chest pain, does not has orthopnea.  No peripheral edema.  No extremity pain or unilateral extremity swelling.  She denies palpitations, fevers, cough, also denies abdominal pain, vomiting or diarrhea.  She has noted her blood pressure has been increasingly elevated this week.  She was actually triaged here yesterday at which time she had lab test drawn but left prior to being evaluated.  She states her symptoms feel like she felt the last time she needed a blood transfusion.  Her headache is described as a tight helmet on her head.  She denies vision changes, no fevers, neck pain or stiffness, no recent other illnesses.   The history is provided by the patient.       Prior to Admission medications   Medication Sig Start Date End Date Taking? Authorizing Provider  albuterol  (VENTOLIN  HFA) 108 (90 Base) MCG/ACT inhaler Inhale 1-2 puffs into the lungs every 6 (six) hours as needed for wheezing or shortness of breath. 02/16/24  Yes Jenessa Gillingham, PA-C  lisinopril  (ZESTRIL ) 10 MG tablet Take 1 tablet (10 mg total) by mouth daily. 02/16/24  Yes Neesha Langton, PA-C  acetaminophen  (TYLENOL ) 500 MG tablet Take 1 tablet (500 mg total) by mouth every 6 (six) hours as needed for mild pain, moderate pain, fever or headache. 12/11/22   Maree, Pratik D, DO  lidocaine  (LIDODERM ) 5 % Place 1 patch onto the skin daily. Remove & Discard patch within  12 hours or as directed by MD 12/12/22   Maree, Pratik D, DO  loratadine  (CLARITIN ) 10 MG tablet Take 10 mg by mouth daily.    [provider]  omeprazole  (PRILOSEC) 40 MG capsule TAKE 1 CAPSULE(40 MG) BY MOUTH IN THE MORNING AND AT BEDTIME Patient taking differently: Take 40 mg by mouth 2 (two) times daily. 03/31/20   Mevelyn Romero NOVAK, PA-C  ferrous sulfate  325 (65 FE) MG tablet Take 1 tablet (325 mg total) by mouth daily. 11/08/17 01/08/19  Cleotilde Rogue, MD    Allergies: Biaxin [clarithromycin]    Review of Systems  Constitutional:  Positive for fatigue. Negative for chills, diaphoresis and fever.  HENT:  Negative for congestion and sore throat.   Eyes: Negative.  Negative for visual disturbance.  Respiratory:  Positive for chest tightness and shortness of breath.   Cardiovascular:  Negative for chest pain, palpitations and leg swelling.  Gastrointestinal:  Positive for nausea. Negative for abdominal pain, constipation, diarrhea and vomiting.  Genitourinary: Negative.   Musculoskeletal:  Negative for arthralgias, joint swelling and neck pain.  Skin: Negative.  Negative for rash and wound.  Neurological:  Positive for headaches. Negative for dizziness, weakness, light-headedness and numbness.  Psychiatric/Behavioral: Negative.      Updated Vital Signs BP (!) 169/89   Pulse 71   Temp 98.2 F (36.8 C) (Oral)   Resp 13   Ht 5' 1 (1.549 m)   Wt 88.5 kg   LMP 01/07/2020   SpO2 96%  BMI 36.84 kg/m   Physical Exam Vitals and nursing note reviewed.  Constitutional:      Appearance: She is well-developed.  HENT:     Head: Normocephalic and atraumatic.     Right Ear: Tympanic membrane normal.     Left Ear: Tympanic membrane normal.  Eyes:     Extraocular Movements: Extraocular movements intact.     Conjunctiva/sclera: Conjunctivae normal.     Pupils: Pupils are equal, round, and reactive to light.  Cardiovascular:     Rate and Rhythm: Normal rate and regular rhythm.      Heart sounds: Normal heart sounds.  Pulmonary:     Effort: Pulmonary effort is normal.     Breath sounds: Normal breath sounds. No wheezing, rhonchi or rales.  Abdominal:     General: Bowel sounds are normal.     Palpations: Abdomen is soft.     Tenderness: There is no abdominal tenderness.  Musculoskeletal:        General: Normal range of motion.     Cervical back: Normal range of motion and neck supple.  Lymphadenopathy:     Cervical: No cervical adenopathy.  Skin:    General: Skin is warm and dry.     Findings: No rash.  Neurological:     General: No focal deficit present.     Mental Status: She is alert and oriented to person, place, and time.     GCS: GCS eye subscore is 4. GCS verbal subscore is 5. GCS motor subscore is 6.     Cranial Nerves: No cranial nerve deficit.     Sensory: No sensory deficit.     Coordination: Coordination normal.     Gait: Gait normal.     Deep Tendon Reflexes: Reflexes normal.     Comments: Normal heel-shin, normal rapid alternating movements. Cranial nerves III-XII intact.  No pronator drift.  Psychiatric:        Speech: Speech normal.        Behavior: Behavior normal.        Thought Content: Thought content normal.     (all labs ordered are listed, but only abnormal results are displayed) Labs Reviewed  BASIC METABOLIC PANEL WITH GFR - Abnormal; Notable for the following components:      Result Value   Glucose, Bld 122 (*)    All other components within normal limits  D-DIMER, QUANTITATIVE - Abnormal; Notable for the following components:   D-Dimer, Quant 0.52 (*)    All other components within normal limits  CBC  TROPONIN I (HIGH SENSITIVITY)    EKG: None  ED ECG REPORT   Date: 02/16/2024  Rate: 85  Rhythm: normal sinus rhythm  QRS Axis: normal  Intervals: normal  ST/T Wave abnormalities: normal  Conduction Disutrbances:none  Narrative Interpretation:   Old EKG Reviewed: unchanged  I have personally reviewed the EKG  tracing and agree with the computerized printout as noted.   Radiology: CT Angio Chest PE W and/or Wo Contrast Result Date: 02/16/2024 CLINICAL DATA:  Pulmonary embolism (PE) suspected, high prob recurrent SOB and chest pain. Pt was seen here yesterday but reports she unfortunately had to leave following Triage. Pt reports going to Urgent care today, and was advised of her test results yesterday EXAM: CT ANGIOGRAPHY CHEST WITH CONTRAST TECHNIQUE: Multidetector CT imaging of the chest was performed using the standard protocol during bolus administration of intravenous contrast. Multiplanar CT image reconstructions and MIPs were obtained to evaluate the vascular anatomy. RADIATION DOSE REDUCTION:  This exam was performed according to the departmental dose-optimization program which includes automated exposure control, adjustment of the mA and/or kV according to patient size and/or use of iterative reconstruction technique. CONTRAST:  75mL OMNIPAQUE  IOHEXOL  350 MG/ML SOLN COMPARISON:  Chest x-ray 02/16/2024 FINDINGS: Cardiovascular: Satisfactory opacification of the pulmonary arteries to the segmental level. No evidence of pulmonary embolism. Normal heart size. No significant pericardial effusion. The thoracic aorta is normal in caliber. No atherosclerotic plaque of the thoracic aorta. No coronary artery calcifications. Mediastinum/Nodes: No enlarged mediastinal, hilar, or axillary lymph nodes. Thyroid  gland, trachea, and esophagus demonstrate no significant findings. Lungs/Pleura: No focal consolidation. No pulmonary nodule. No pulmonary mass. No pleural effusion. No pneumothorax. Upper Abdomen: Fluid dense lesion of the right kidney likely represents a simple renal cyst. Simple renal cysts, in the absence of clinically indicated signs/symptoms, require no independent follow-up. Musculoskeletal: No chest wall abnormality. No suspicious lytic or blastic osseous lesions. No acute displaced fracture. Review of the MIP  images confirms the above findings. IMPRESSION: 1. No pulmonary embolus. 2. No acute intrathoracic abnormality. Electronically Signed   By: Morgane  Naveau M.D.   On: 02/16/2024 22:51   CT Head Wo Contrast Result Date: 02/16/2024 CLINICAL DATA:  Headache EXAM: CT HEAD WITHOUT CONTRAST TECHNIQUE: Contiguous axial images were obtained from the base of the skull through the vertex without intravenous contrast. RADIATION DOSE REDUCTION: This exam was performed according to the departmental dose-optimization program which includes automated exposure control, adjustment of the mA and/or kV according to patient size and/or use of iterative reconstruction technique. COMPARISON:  None Available. FINDINGS: Brain: Scattered hypodensities are seen throughout the periventricular white matter consistent with age-indeterminate small vessel ischemic changes. No other signs of acute infarct or hemorrhage. Lateral ventricles and midline structures are unremarkable. No acute extra-axial fluid collections. No mass effect. Vascular: No hyperdense vessel or unexpected calcification. Skull: Normal. Negative for fracture or focal lesion. Sinuses/Orbits: Mucosal thickening and retained secretions within the sphenoid sinuses. Opacification of the right mastoid air cells, with fluid in the right middle ear. Other: None. IMPRESSION: 1. Right otitis media, with large right mastoid effusion. 2. Sphenoid sinus disease. 3. Age-indeterminate small vessel ischemic changes within the white matter. Electronically Signed   By: Ozell Daring M.D.   On: 02/16/2024 19:23   DG Chest Port 1 View Result Date: 02/16/2024 CLINICAL DATA:  Chest pain EXAM: PORTABLE CHEST 1 VIEW COMPARISON:  02/15/2024, 12/10/2022, 12/01/2019 FINDINGS: No acute airspace disease or effusion. Stable cardiomediastinal silhouette. Previously noted right lung base opacity is less apparent. Faint nodular opacity in the medial left apex, possible bony summation artifact.  IMPRESSION: 1. No active disease. 2. Faint nodular opacity in the medial left apex, possible bony summation artifact but nodule not excluded. Previously noted airspace opacity at the right base is less apparent. Suggest CT 6 for further evaluation of these findings Electronically Signed   By: Luke Bun M.D.   On: 02/16/2024 18:53   DG Chest 2 View Result Date: 02/15/2024 CLINICAL DATA:  Chest pain and shortness of breath EXAM: CHEST - 2 VIEW COMPARISON:  Radiographs 12/10/2022 FINDINGS: Nodular airspace opacity projecting over the right lower lung on frontal view without correlate on lateral view. This may be projectional artifact, atelectasis, or a pulmonary nodule. CT is recommended for further evaluation. No pleural effusion or pneumothorax. Stable cardiomediastinal silhouette. IMPRESSION: 1. Nodular airspace opacity projecting over the right lower lung on frontal view without correlate on lateral view. This may be projectional artifact, atelectasis, or  a pulmonary nodule. CT is recommended for further evaluation. Electronically Signed   By: Norman Gatlin M.D.   On: 02/15/2024 20:14     Procedures   Medications Ordered in the ED  ondansetron  (ZOFRAN ) injection 4 mg (4 mg Intravenous Given 02/16/24 1835)  acetaminophen  (TYLENOL ) tablet 650 mg (650 mg Oral Given 02/16/24 1830)  hydrALAZINE  (APRESOLINE ) injection 10 mg (10 mg Intravenous Given 02/16/24 1838)  morphine  (PF) 4 MG/ML injection 4 mg (4 mg Intravenous Given 02/16/24 2013)  metoCLOPramide  (REGLAN ) injection 10 mg (10 mg Intravenous Given 02/16/24 2013)  metoprolol  tartrate (LOPRESSOR ) injection 5 mg (5 mg Intravenous Given 02/16/24 2248)  iohexol  (OMNIPAQUE ) 350 MG/ML injection 75 mL (75 mLs Intravenous Contrast Given 02/16/24 2238)                                    Medical Decision Making Patient presenting with multiple complaints, headache, right-sided chest pain along with difficulty with dyspnea, describing having difficulty  intermittently catching her breath.  She does present with significant hypertension and is currently out of her lisinopril  as she does not currently have a PCP in order to obtain ongoing medications.  Differential diagnosis including hypertensive emergency/urgency, pneumonia, CHF, ACS, PE, viral illness, musculoskeletal chest pain.  Labs and imaging today are reassuring.  She was given an IV dose of hydralazine  which did not improve her blood pressure significantly, neither did IV metoprolol .  She was then given her p.o. dose of lisinopril  after which her blood pressure was starting to improve, prior to discharge her BP was 169/89.  There is no sign of endorgan damage based on today's workup, she has a normal head CT with no evidence for subdural or other complication of elevated BP.  Her chest x-ray showed a nodular opacity at the left apex, recommended CT imaging, given her dyspnea CT angio was completed which also ruled out PE.  Other labs reassuring including normal kidney function, EKG normal as well.  She was prescribed her lisinopril  and given referrals for obtaining primary care.  Amount and/or Complexity of Data Reviewed Labs: ordered.    Details: Negative delta troponins, B met, CBC reassuring, she did have a borderline D-dimer at 0.52, technically age-adjusted normal but given symptoms we did proceed to CT angio which as mentioned above was negative for PE or any other lung abnormalities. Radiology: ordered. ECG/medicine tests: ordered.    Details: Normal sinus rhythm rate 85.  Risk Prescription drug management.        Final diagnoses:  Dyspnea, unspecified type  Hypertension, unspecified type    ED Discharge Orders          Ordered    lisinopril  (ZESTRIL ) 10 MG tablet  Daily        02/16/24 2323    albuterol  (VENTOLIN  HFA) 108 (90 Base) MCG/ACT inhaler  Every 6 hours PRN        02/16/24 2323               Keyuana Wank, PA-C 02/16/24 2335    Cleotilde Rogue,  MD 02/17/24 1730

## 2024-02-16 NOTE — ED Triage Notes (Signed)
 Pt arrived via POV c/o recurrent SOB and chest pain. Pt was seen here yesterday but reports she unfortunately had to leave following Triage. Pt reports going to Urgent care today, and was advised of her test results yesterday and was advised to return to [facility] for further treatment.
